# Patient Record
Sex: Female | Born: 1960
Health system: Southern US, Community
[De-identification: ages and names within clinical notes are randomized; demographics above are authoritative.]

## PROBLEM LIST (undated history)

## (undated) HISTORY — PX: CERVICAL ABLATION: SHX5771

---

## 2012-11-06 ENCOUNTER — Ambulatory Visit: Payer: Self-pay | Admitting: Internal Medicine

## 2012-12-03 ENCOUNTER — Ambulatory Visit: Payer: Self-pay | Admitting: Internal Medicine

## 2013-12-09 ENCOUNTER — Ambulatory Visit: Payer: Self-pay | Admitting: Internal Medicine

## 2013-12-09 LAB — HM MAMMOGRAPHY: HM MAMMO: NORMAL

## 2013-12-09 LAB — LIPID PANEL
CHOLESTEROL: 188 mg/dL (ref 0–200)
HDL: 62 mg/dL (ref 35–70)
LDL Cholesterol: 110 mg/dL
TRIGLYCERIDES: 79 mg/dL (ref 40–160)

## 2013-12-09 LAB — TSH: TSH: 0.1 u[IU]/mL — AB (ref <=5.90)

## 2013-12-09 LAB — HM PAP SMEAR: HM PAP: NEGATIVE

## 2013-12-09 LAB — CBC AND DIFFERENTIAL: HEMOGLOBIN: 13.4 g/dL (ref 12.0–16.0)

## 2014-07-25 ENCOUNTER — Encounter: Payer: Self-pay | Admitting: Internal Medicine

## 2014-07-25 ENCOUNTER — Other Ambulatory Visit: Payer: Self-pay | Admitting: Internal Medicine

## 2014-07-25 DIAGNOSIS — G43009 Migraine without aura, not intractable, without status migrainosus: Secondary | ICD-10-CM | POA: Insufficient documentation

## 2014-07-25 DIAGNOSIS — Z8371 Family history of colonic polyps: Secondary | ICD-10-CM | POA: Insufficient documentation

## 2014-07-25 DIAGNOSIS — N951 Menopausal and female climacteric states: Secondary | ICD-10-CM | POA: Insufficient documentation

## 2014-07-25 DIAGNOSIS — F32A Depression, unspecified: Secondary | ICD-10-CM | POA: Insufficient documentation

## 2014-07-25 DIAGNOSIS — G2581 Restless legs syndrome: Secondary | ICD-10-CM | POA: Insufficient documentation

## 2014-07-25 DIAGNOSIS — E039 Hypothyroidism, unspecified: Secondary | ICD-10-CM | POA: Insufficient documentation

## 2014-07-25 DIAGNOSIS — F329 Major depressive disorder, single episode, unspecified: Secondary | ICD-10-CM | POA: Insufficient documentation

## 2014-07-25 DIAGNOSIS — R87619 Unspecified abnormal cytological findings in specimens from cervix uteri: Secondary | ICD-10-CM | POA: Insufficient documentation

## 2014-07-25 HISTORY — DX: Depression, unspecified: F32.A

## 2014-07-25 HISTORY — DX: Menopausal and female climacteric states: N95.1

## 2014-08-28 ENCOUNTER — Other Ambulatory Visit: Payer: Self-pay

## 2014-08-28 MED ORDER — LEVOTHYROXINE SODIUM 75 MCG PO TABS: 75.0000 ug | ORAL_TABLET | Freq: Every day | ORAL | Status: DC

## 2014-08-28 MED ORDER — ESCITALOPRAM OXALATE 10 MG PO TABS: 10.0000 mg | ORAL_TABLET | Freq: Every day | ORAL | Status: DC

## 2015-03-01 ENCOUNTER — Other Ambulatory Visit: Payer: Self-pay | Admitting: Internal Medicine

## 2015-03-03 NOTE — Telephone Encounter (Signed)
pts coming in on 1/31 for cpe

## 2015-03-23 ENCOUNTER — Encounter: Payer: Self-pay | Admitting: Internal Medicine

## 2015-03-23 ENCOUNTER — Ambulatory Visit (INDEPENDENT_AMBULATORY_CARE_PROVIDER_SITE_OTHER): Payer: BLUE CROSS/BLUE SHIELD | Admitting: Internal Medicine

## 2015-03-23 VITALS — BP 104/60 | HR 84 | Ht 65.0 in | Wt 147.8 lb

## 2015-03-23 DIAGNOSIS — Z1239 Encounter for other screening for malignant neoplasm of breast: Secondary | ICD-10-CM

## 2015-03-23 DIAGNOSIS — E034 Atrophy of thyroid (acquired): Secondary | ICD-10-CM | POA: Diagnosis not present

## 2015-03-23 DIAGNOSIS — Z1159 Encounter for screening for other viral diseases: Secondary | ICD-10-CM | POA: Diagnosis not present

## 2015-03-23 DIAGNOSIS — N951 Menopausal and female climacteric states: Secondary | ICD-10-CM

## 2015-03-23 DIAGNOSIS — E038 Other specified hypothyroidism: Secondary | ICD-10-CM

## 2015-03-23 DIAGNOSIS — Z0000 Encounter for general adult medical examination without abnormal findings: Secondary | ICD-10-CM

## 2015-03-23 MED ORDER — LEVOTHYROXINE SODIUM 75 MCG PO TABS: 75.0000 ug | ORAL_TABLET | Freq: Every day | ORAL | Status: DC

## 2015-03-23 NOTE — Patient Instructions (Signed)
Breast Self-Awareness Practicing breast self-awareness may pick up problems early, prevent significant medical complications, and possibly save your life. By practicing breast self-awareness, you can become familiar with how your breasts look and feel and if your breasts are changing. This allows you to notice changes early. It can also offer you some reassurance that your breast health is good. One way to learn what is normal for your breasts and whether your breasts are changing is to do a breast self-exam. If you find a lump or something that was not present in the past, it is best to contact your caregiver right away. Other findings that should be evaluated by your caregiver include nipple discharge, especially if it is bloody; skin changes or reddening; areas where the skin seems to be pulled in (retracted); or new lumps and bumps. Breast pain is seldom associated with cancer (malignancy), but should also be evaluated by a caregiver. HOW TO PERFORM A BREAST SELF-EXAM The best time to examine your breasts is 5-7 days after your menstrual period is over. During menstruation, the breasts are lumpier, and it may be more difficult to pick up changes. If you do not menstruate, have reached menopause, or had your uterus removed (hysterectomy), you should examine your breasts at regular intervals, such as monthly. If you are breastfeeding, examine your breasts after a feeding or after using a breast pump. Breast implants do not decrease the risk for lumps or tumors, so continue to perform breast self-exams as recommended. Talk to your caregiver about how to determine the difference between the implant and breast tissue. Also, talk about the amount of pressure you should use during the exam. Over time, you will become more familiar with the variations of your breasts and more comfortable with the exam. A breast self-exam requires you to remove all your clothes above the waist. 1. Look at your breasts and nipples.  Stand in front of a mirror in a room with good lighting. With your hands on your hips, push your hands firmly downward. Look for a difference in shape, contour, and size from one breast to the other (asymmetry). Asymmetry includes puckers, dips, or bumps. Also, look for skin changes, such as reddened or scaly areas on the breasts. Look for nipple changes, such as discharge, dimpling, repositioning, or redness. 2. Carefully feel your breasts. This is best done either in the shower or tub while using soapy water or when flat on your back. Place the arm (on the side of the breast you are examining) above your head. Use the pads (not the fingertips) of your three middle fingers on your opposite hand to feel your breasts. Start in the underarm area and use  inch (2 cm) overlapping circles to feel your breast. Use 3 different levels of pressure (light, medium, and firm pressure) at each circle before moving to the next circle. The light pressure is needed to feel the tissue closest to the skin. The medium pressure will help to feel breast tissue a little deeper, while the firm pressure is needed to feel the tissue close to the ribs. Continue the overlapping circles, moving downward over the breast until you feel your ribs below your breast. Then, move one finger-width towards the center of the body. Continue to use the  inch (2 cm) overlapping circles to feel your breast as you move slowly up toward the collar bone (clavicle) near the base of the neck. Continue the up and down exam using all 3 pressures until you reach the   middle of the chest. Do this with each breast, carefully feeling for lumps or changes. 3.  Keep a written record with breast changes or normal findings for each breast. By writing this information down, you do not need to depend only on memory for size, tenderness, or location. Write down where you are in your menstrual cycle, if you are still menstruating. Breast tissue can have some lumps or  thick tissue. However, see your caregiver if you find anything that concerns you.  SEEK MEDICAL CARE IF:  You see a change in shape, contour, or size of your breasts or nipples.   You see skin changes, such as reddened or scaly areas on the breasts or nipples.   You have an unusual discharge from your nipples.   You feel a new lump or unusually thick areas.    This information is not intended to replace advice given to you by your health care provider. Make sure you discuss any questions you have with your health care provider.   Document Released: 02/06/2005 Document Revised: 01/24/2012 Document Reviewed: 05/24/2011 Elsevier Interactive Patient Education 2016 [employer]  

## 2015-03-23 NOTE — Progress Notes (Signed)
Date:  03/23/2015   Name:  Courtney Davis   DOB:  05/03/1960   MRN:  [id number]   Chief Complaint: Annual Exam Courtney Davis is a 55 y.o. female who presents today for her Complete Annual Exam. She feels well. She reports exercising regularly. She reports she is sleeping poorly due to hot flashes. She is very hesitant to have a colonoscopy. She would be a good candidate for cologuard testing. She is slightly concerned she's gained about 15 pounds in the past year and a half - she continues to exercise but attributes her weight gain to increased sweets and cravings.  Thyroid Problem Presents for follow-up visit. Patient reports no anxiety, constipation, depressed mood, diaphoresis, fatigue or palpitations. The symptoms have been stable. Past treatments include levothyroxine. The treatment provided significant relief.  Depression        The current episode started more than 1 month ago.   The onset quality is gradual (due to sweats at night).   The problem occurs rarely.  The problem has been resolved since onset.  Associated symptoms include headaches.  Associated symptoms include no decreased concentration and no fatigue.  Past treatments include SSRIs - Selective serotonin reuptake inhibitors (weaned off of lexapro several months ago).  Past medical history includes thyroid problem.    Depression screen [facility] 2/9 03/23/2015  Decreased Interest 0  Down, Depressed, Hopeless 0  PHQ - 2 Score 0     Review of Systems  Constitutional: Negative for fever, chills, diaphoresis and fatigue.  HENT: Negative for hearing loss, sinus pressure, tinnitus, trouble swallowing and voice change.   Eyes: Negative for visual disturbance.  Respiratory: Negative for cough, chest tightness and shortness of breath.   Cardiovascular: Negative for chest pain, palpitations and leg swelling.  Gastrointestinal: Negative for constipation and blood in stool.  Genitourinary: Negative for dysuria, vaginal  bleeding, vaginal discharge and difficulty urinating.  Musculoskeletal: Negative for back pain, joint swelling and arthralgias.  Skin: Negative for color change and rash.  Neurological: Positive for headaches. Negative for dizziness and light-headedness.  Hematological: Negative for adenopathy.  Psychiatric/Behavioral: Positive for depression and sleep disturbance. Negative for dysphoric mood and decreased concentration. The patient is not nervous/anxious.     Patient Active Problem List   Diagnosis Date Noted  . Hypothyroidism due to acquired atrophy of thyroid 03/23/2015  . Abnormal cervical Papanicolaou smear 07/25/2014  . Clinical depression 07/25/2014  . Family history of colonic polyps 07/25/2014  . Climacteric 07/25/2014  . Migraine without aura and responsive to treatment 07/25/2014  . Restless leg 07/25/2014    Prior to Admission medications   Medication Sig Start Date End Date Taking? Authorizing Provider  levothyroxine (SYNTHROID, LEVOTHROID) 75 MCG tablet TAKE ONE TABLET BY MOUTH ONCE DAILY 03/01/15  Yes Glean Hess, MD  clonazePAM (KLONOPIN) 0.5 MG tablet TAKE 1 1/2 TABLET BY MOUTH EVERY DAY Patient not taking: Reported on 03/23/2015 07/27/14   Glean Hess, MD  escitalopram (LEXAPRO) 10 MG tablet TAKE ONE TABLET BY MOUTH ONCE DAILY Patient not taking: Reported on 03/23/2015 03/01/15   Glean Hess, MD    No Known Allergies  Past Surgical History  Procedure Laterality Date  . Cervical ablation      Social History  Substance Use Topics  . Smoking status: Never Smoker   . Smokeless tobacco: None  . Alcohol Use: 1.2 oz/week    2 Standard drinks or equivalent per week     Comment: occasional  Medication list has been reviewed and updated.   Physical Exam  Constitutional: She is oriented to person, place, and time. She appears well-developed and well-nourished. No distress.  HENT:  Head: Normocephalic and atraumatic.  Right Ear: Tympanic membrane  and ear canal normal.  Left Ear: Tympanic membrane and ear canal normal.  Nose: Right sinus exhibits no maxillary sinus tenderness. Left sinus exhibits no maxillary sinus tenderness.  Mouth/Throat: Uvula is midline and oropharynx is clear and moist.  Eyes: Conjunctivae and EOM are normal. Right eye exhibits no discharge. Left eye exhibits no discharge. No scleral icterus.  Neck: Normal range of motion. Carotid bruit is not present. No erythema present. No thyromegaly present.  Cardiovascular: Normal rate, regular rhythm, normal heart sounds and normal pulses.   Pulmonary/Chest: Effort normal. No respiratory distress. She has no wheezes. Right breast exhibits no mass, no nipple discharge, no skin change and no tenderness. Left breast exhibits no mass, no nipple discharge, no skin change and no tenderness.  Abdominal: Soft. Bowel sounds are normal. There is no hepatosplenomegaly. There is no tenderness. There is no CVA tenderness.  Musculoskeletal: Normal range of motion.  Lymphadenopathy:    She has no cervical adenopathy.    She has no axillary adenopathy.  Neurological: She is alert and oriented to person, place, and time. She has normal reflexes. No cranial nerve deficit or sensory deficit.  Skin: Skin is warm, dry and intact. No rash noted.  Psychiatric: She has a normal mood and affect. Her speech is normal and behavior is normal. Thought content normal.  Nursing note and vitals reviewed.   BP 104/60 mmHg  Pulse 84  Ht 5\' 5"  (1.651 m)  Wt 147 lb 12.8 oz (67.042 kg)  BMI 24.60 kg/m2  Assessment and Plan: 1. Annual physical exam Pt will check on Cologuard coverage Doing well off of anti-depressant medication - CBC with Differential/Platelet - Comprehensive metabolic panel - Lipid panel - POCT urinalysis dipstick  2. Breast cancer screening - MM DIGITAL SCREENING BILATERAL; Future  3. Hypothyroidism due to acquired atrophy of thyroid supplemented - TSH - levothyroxine  (SYNTHROID, LEVOTHROID) 75 MCG tablet; Take 1 tablet (75 mcg total) by mouth daily.  Dispense: 90 tablet; Refill: 3  4. Climacteric Causing some sleep issues and weight gain May have been attenuated by SSRI therapy that she recently stopped Continue exercise and monitor diet OTC sleep aids or soy/black cohosh supplements may be helpful  5. Need for hepatitis C screening test - Hepatitis C antibody   Halina Maidens, MD [facility]  03/23/2015

## 2015-03-24 ENCOUNTER — Telehealth: Payer: Self-pay

## 2015-03-24 LAB — LIPID PANEL
CHOLESTEROL TOTAL: 204 mg/dL — AB (ref 100–199)
Chol/HDL Ratio: 3.3 ratio units (ref 0.0–4.4)
HDL: 61 mg/dL (ref >39)
LDL CALC: 126 mg/dL — AB (ref 0–99)
TRIGLYCERIDES: 83 mg/dL (ref 0–149)
VLDL CHOLESTEROL CAL: 17 mg/dL (ref 5–40)

## 2015-03-24 LAB — CBC WITH DIFFERENTIAL/PLATELET
BASOS: 1 %
Basophils Absolute: 0.1 10*3/uL (ref 0.0–0.2)
EOS (ABSOLUTE): 0.1 10*3/uL (ref 0.0–0.4)
EOS: 2 %
Hematocrit: 40.7 % (ref 34.0–46.6)
Hemoglobin: 13.3 g/dL (ref 11.1–15.9)
IMMATURE GRANS (ABS): 0 10*3/uL (ref 0.0–0.1)
IMMATURE GRANULOCYTES: 0 %
LYMPHS: 38 %
Lymphocytes Absolute: 2.1 10*3/uL (ref 0.7–3.1)
MCH: 29.4 pg (ref 26.6–33.0)
MCHC: 32.7 g/dL (ref 31.5–35.7)
MCV: 90 fL (ref 79–97)
MONOS ABS: 0.5 10*3/uL (ref 0.1–0.9)
Monocytes: 8 %
NEUTROS PCT: 51 %
Neutrophils Absolute: 2.7 10*3/uL (ref 1.4–7.0)
PLATELETS: 248 10*3/uL (ref 150–379)
RBC: 4.53 x10E6/uL (ref 3.77–5.28)
RDW: 13.7 % (ref 12.3–15.4)
WBC: 5.4 10*3/uL (ref 3.4–10.8)

## 2015-03-24 LAB — COMPREHENSIVE METABOLIC PANEL
ALT: 18 IU/L (ref 0–32)
AST: 18 IU/L (ref 0–40)
Albumin/Globulin Ratio: 1.7 (ref 1.1–2.5)
Albumin: 4.4 g/dL (ref 3.5–5.5)
Alkaline Phosphatase: 58 IU/L (ref 39–117)
BUN/Creatinine Ratio: 26 — ABNORMAL HIGH (ref 9–23)
BUN: 24 mg/dL (ref 6–24)
Bilirubin Total: 0.3 mg/dL (ref 0.0–1.2)
CALCIUM: 9.4 mg/dL (ref 8.7–10.2)
CO2: 25 mmol/L (ref 18–29)
CREATININE: 0.91 mg/dL (ref 0.57–1.00)
Chloride: 101 mmol/L (ref 96–106)
GFR, EST AFRICAN AMERICAN: 82 mL/min/{1.73_m2} (ref >59)
GFR, EST NON AFRICAN AMERICAN: 71 mL/min/{1.73_m2} (ref >59)
Globulin, Total: 2.6 g/dL (ref 1.5–4.5)
Glucose: 80 mg/dL (ref 65–99)
POTASSIUM: 4.1 mmol/L (ref 3.5–5.2)
Sodium: 140 mmol/L (ref 134–144)
TOTAL PROTEIN: 7 g/dL (ref 6.0–8.5)

## 2015-03-24 LAB — HEPATITIS C ANTIBODY: Hep C Virus Ab: <0.1 s/co ratio (ref 0.0–0.9)

## 2015-03-24 LAB — TSH: TSH: 0.196 u[IU]/mL — ABNORMAL LOW (ref 0.450–4.500)

## 2015-03-24 NOTE — Telephone Encounter (Signed)
Spoke with patient. Patient advised of all results and verbalized understanding. Will call back with any future questions or concerns. MAH  

## 2015-03-24 NOTE — Telephone Encounter (Signed)
-----   Message from Glean Hess, MD sent at 03/24/2015 11:55 AM EST ----- Labs are good.  Cholesterol is borderline elevated but overall risk is normal so no medication is needed.

## 2015-03-24 NOTE — Telephone Encounter (Signed)
Left message for patient to call back  

## 2016-03-31 ENCOUNTER — Ambulatory Visit (INDEPENDENT_AMBULATORY_CARE_PROVIDER_SITE_OTHER): Payer: BLUE CROSS/BLUE SHIELD | Admitting: Internal Medicine

## 2016-03-31 ENCOUNTER — Encounter: Payer: Self-pay | Admitting: Internal Medicine

## 2016-03-31 VITALS — BP 126/76 | HR 80 | Temp 98.0°F | Resp 16 | Ht 65.0 in | Wt 153.0 lb

## 2016-03-31 DIAGNOSIS — Z0000 Encounter for general adult medical examination without abnormal findings: Secondary | ICD-10-CM | POA: Diagnosis not present

## 2016-03-31 DIAGNOSIS — Z1231 Encounter for screening mammogram for malignant neoplasm of breast: Secondary | ICD-10-CM | POA: Diagnosis not present

## 2016-03-31 DIAGNOSIS — E034 Atrophy of thyroid (acquired): Secondary | ICD-10-CM | POA: Diagnosis not present

## 2016-03-31 DIAGNOSIS — Z1239 Encounter for other screening for malignant neoplasm of breast: Secondary | ICD-10-CM

## 2016-03-31 DIAGNOSIS — G43009 Migraine without aura, not intractable, without status migrainosus: Secondary | ICD-10-CM

## 2016-03-31 DIAGNOSIS — N951 Menopausal and female climacteric states: Secondary | ICD-10-CM

## 2016-03-31 LAB — POCT URINALYSIS DIPSTICK
Bilirubin, UA: NEGATIVE
GLUCOSE UA: NEGATIVE
Ketones, UA: NEGATIVE
Leukocytes, UA: NEGATIVE
NITRITE UA: NEGATIVE
PH UA: 6
Protein, UA: NEGATIVE
SPEC GRAV UA: 1.015
UROBILINOGEN UA: 0.2

## 2016-03-31 MED ORDER — ESTRADIOL 0.5 MG PO TABS: 0.5000 mg | ORAL_TABLET | Freq: Every day | ORAL | 12 refills | Status: DC

## 2016-03-31 MED ORDER — MEDROXYPROGESTERONE ACETATE 2.5 MG PO TABS: 2.5000 mg | ORAL_TABLET | Freq: Every day | ORAL | 12 refills | Status: DC

## 2016-03-31 MED ORDER — LEVOTHYROXINE SODIUM 75 MCG PO TABS: 75.0000 ug | ORAL_TABLET | Freq: Every day | ORAL | 3 refills | Status: DC

## 2016-03-31 NOTE — Patient Instructions (Signed)
Check Cologuard coverage  Schedule Mammogram  Consult dermatology for Skin Survey

## 2016-03-31 NOTE — Progress Notes (Signed)
Date:  03/31/2016   Name:  Courtney Davis   DOB:  Apr 06, 1960   MRN:  [id number]   Chief Complaint: Annual Exam and Night Sweats Lilan Masso Wigand is a 56 y.o. female who presents today for her Complete Annual Exam. She feels well. She reports exercising regularly. She reports she is sleeping poorly due to sweats from menopause.   Thyroid Problem  Presents for follow-up visit. Patient reports no anxiety, constipation, diarrhea, fatigue, menstrual problem, palpitations, tremors or weight loss. The symptoms have been stable.  Migraine   This is a recurrent problem. The problem occurs seasonly. The pain is located in the bilateral and frontal region. The pain quality is similar to prior headaches. Pertinent negatives include no abdominal pain, coughing, dizziness, fever, hearing loss, tinnitus, vomiting or weight loss.   Menopausal sx - having terrible sweats at night - awakening several times at night.  She has not taken HRT or tried any soy or black cohosh.   Review of Systems  Constitutional: Negative for chills, fatigue, fever and weight loss.  HENT: Negative for congestion, hearing loss, tinnitus, trouble swallowing and voice change.   Eyes: Negative for visual disturbance.  Respiratory: Negative for cough, chest tightness, shortness of breath and wheezing.   Cardiovascular: Negative for chest pain, palpitations and leg swelling.  Gastrointestinal: Negative for abdominal pain, constipation, diarrhea and vomiting.  Endocrine: Negative for polydipsia and polyuria.  Genitourinary: Negative for dysuria, frequency, genital sores, menstrual problem, vaginal bleeding and vaginal discharge.       Severe night sweats  Musculoskeletal: Negative for arthralgias, gait problem and joint swelling.  Skin: Negative for color change and rash.  Neurological: Negative for dizziness, tremors, light-headedness and headaches.  Hematological: Negative for adenopathy. Does not bruise/bleed easily.    Psychiatric/Behavioral: Positive for sleep disturbance. Negative for dysphoric mood. The patient is not nervous/anxious.     Patient Active Problem List   Diagnosis Date Noted  . Hypothyroidism due to acquired atrophy of thyroid 03/23/2015  . Abnormal cervical Papanicolaou smear 07/25/2014  . Clinical depression 07/25/2014  . Family history of colonic polyps 07/25/2014  . Climacteric 07/25/2014  . Migraine without aura and responsive to treatment 07/25/2014  . Restless leg 07/25/2014    Prior to Admission medications   Medication Sig Start Date End Date Taking? Authorizing Provider  levothyroxine (SYNTHROID, LEVOTHROID) 75 MCG tablet Take 1 tablet (75 mcg total) by mouth daily. 03/23/15  Yes Glean Hess, MD    No Known Allergies  Past Surgical History:  Procedure Laterality Date  . CERVICAL ABLATION      Social History  Substance Use Topics  . Smoking status: Never Smoker  . Smokeless tobacco: Never Used  . Alcohol use 1.2 oz/week    2 Standard drinks or equivalent per week     Comment: occasional     Medication list has been reviewed and updated.   Physical Exam  Constitutional: She is oriented to person, place, and time. She appears well-developed and well-nourished. No distress.  HENT:  Head: Normocephalic and atraumatic.  Right Ear: Tympanic membrane and ear canal normal.  Left Ear: Tympanic membrane and ear canal normal.  Nose: Right sinus exhibits no maxillary sinus tenderness. Left sinus exhibits no maxillary sinus tenderness.  Mouth/Throat: Uvula is midline and oropharynx is clear and moist.  Eyes: Conjunctivae and EOM are normal. Right eye exhibits no discharge. Left eye exhibits no discharge. No scleral icterus.  Neck: Normal range of motion. Carotid bruit is  not present. No erythema present. No thyromegaly present.  Cardiovascular: Normal rate, regular rhythm, normal heart sounds and normal pulses.   Pulmonary/Chest: Effort normal. No respiratory  distress. She has no wheezes. Right breast exhibits no mass, no nipple discharge, no skin change and no tenderness. Left breast exhibits no mass, no nipple discharge, no skin change and no tenderness.  Abdominal: Soft. Bowel sounds are normal. There is no hepatosplenomegaly. There is no tenderness. There is no CVA tenderness.  Musculoskeletal: Normal range of motion.  Lymphadenopathy:    She has no cervical adenopathy.    She has no axillary adenopathy.  Neurological: She is alert and oriented to person, place, and time. She has normal reflexes. No cranial nerve deficit or sensory deficit.  Skin: Skin is warm, dry and intact. No rash noted.  Psychiatric: She has a normal mood and affect. Her speech is normal and behavior is normal. Thought content normal.  Nursing note and vitals reviewed.   BP 126/76 (BP Location: Right Arm, Patient Position: Sitting, Cuff Size: Normal)   Pulse 80   Temp 98 F (36.7 C)   Resp 16   Ht 5\' 5"  (1.651 m)   Wt 153 lb (69.4 kg)   BMI 25.46 kg/m   Assessment and Plan: 1. Annual physical exam Continue regular exercise and healthy diet Consider Calcium and vitamin D daily - Comprehensive metabolic panel - Lipid panel - POCT urinalysis dipstick  2. Breast cancer screening Schedule mammogram  3. Hypothyroidism due to acquired atrophy of thyroid supplemented - levothyroxine (SYNTHROID, LEVOTHROID) 75 MCG tablet; Take 1 tablet (75 mcg total) by mouth daily.  Dispense: 90 tablet; Refill: 3 - TSH  4. Migraine without aura and responsive to treatment Has responded to otc medications - CBC with Differential/Platelet  5. Climacteric Begin HRT daily and wean down to 2-3 times per week as sx allow - estradiol (ESTRACE) 0.5 MG tablet; Take 1 tablet (0.5 mg total) by mouth daily.  Dispense: 30 tablet; Refill: 12 - medroxyPROGESTERone (PROVERA) 2.5 MG tablet; Take 1 tablet (2.5 mg total) by mouth daily.  Dispense: 30 tablet; Refill: [place],  MD [facility]  03/31/2016

## 2016-04-01 LAB — COMPREHENSIVE METABOLIC PANEL
A/G RATIO: 1.4 (ref 1.2–2.2)
ALK PHOS: 77 IU/L (ref 39–117)
ALT: 17 IU/L (ref 0–32)
AST: 20 IU/L (ref 0–40)
Albumin: 4.2 g/dL (ref 3.5–5.5)
BILIRUBIN TOTAL: 0.3 mg/dL (ref 0.0–1.2)
BUN/Creatinine Ratio: 24 — ABNORMAL HIGH (ref 9–23)
BUN: 23 mg/dL (ref 6–24)
CALCIUM: 9.6 mg/dL (ref 8.7–10.2)
CHLORIDE: 101 mmol/L (ref 96–106)
CO2: 25 mmol/L (ref 18–29)
Creatinine, Ser: 0.97 mg/dL (ref 0.57–1.00)
GFR calc Af Amer: 76 mL/min/{1.73_m2} (ref >59)
GFR calc non Af Amer: 65 mL/min/{1.73_m2} (ref >59)
Globulin, Total: 3 g/dL (ref 1.5–4.5)
Glucose: 73 mg/dL (ref 65–99)
POTASSIUM: 4.6 mmol/L (ref 3.5–5.2)
SODIUM: 142 mmol/L (ref 134–144)
Total Protein: 7.2 g/dL (ref 6.0–8.5)

## 2016-04-01 LAB — LIPID PANEL
CHOLESTEROL TOTAL: 200 mg/dL — AB (ref 100–199)
Chol/HDL Ratio: 3.5 ratio units (ref 0.0–4.4)
HDL: 57 mg/dL (ref >39)
LDL Calculated: 124 mg/dL — ABNORMAL HIGH (ref 0–99)
Triglycerides: 95 mg/dL (ref 0–149)
VLDL Cholesterol Cal: 19 mg/dL (ref 5–40)

## 2016-04-01 LAB — CBC WITH DIFFERENTIAL/PLATELET
BASOS: 2 %
Basophils Absolute: 0.1 10*3/uL (ref 0.0–0.2)
EOS (ABSOLUTE): 0.1 10*3/uL (ref 0.0–0.4)
Eos: 1 %
Hematocrit: 40 % (ref 34.0–46.6)
Hemoglobin: 13 g/dL (ref 11.1–15.9)
IMMATURE GRANULOCYTES: 0 %
Immature Grans (Abs): 0 10*3/uL (ref 0.0–0.1)
LYMPHS ABS: 1.5 10*3/uL (ref 0.7–3.1)
Lymphs: 29 %
MCH: 29.7 pg (ref 26.6–33.0)
MCHC: 32.5 g/dL (ref 31.5–35.7)
MCV: 91 fL (ref 79–97)
MONOS ABS: 0.5 10*3/uL (ref 0.1–0.9)
Monocytes: 10 %
NEUTROS PCT: 58 %
Neutrophils Absolute: 3.1 10*3/uL (ref 1.4–7.0)
PLATELETS: 255 10*3/uL (ref 150–379)
RBC: 4.38 x10E6/uL (ref 3.77–5.28)
RDW: 13.6 % (ref 12.3–15.4)
WBC: 5.3 10*3/uL (ref 3.4–10.8)

## 2016-04-01 LAB — TSH: TSH: 0.782 u[IU]/mL (ref 0.450–4.500)

## 2017-03-23 ENCOUNTER — Ambulatory Visit (INDEPENDENT_AMBULATORY_CARE_PROVIDER_SITE_OTHER): Payer: BLUE CROSS/BLUE SHIELD | Admitting: Internal Medicine

## 2017-03-23 ENCOUNTER — Encounter: Payer: Self-pay | Admitting: Internal Medicine

## 2017-03-23 VITALS — BP 112/64 | HR 65 | Ht 65.0 in | Wt 153.0 lb

## 2017-03-23 DIAGNOSIS — R87610 Atypical squamous cells of undetermined significance on cytologic smear of cervix (ASC-US): Secondary | ICD-10-CM | POA: Diagnosis not present

## 2017-03-23 DIAGNOSIS — Z1231 Encounter for screening mammogram for malignant neoplasm of breast: Secondary | ICD-10-CM

## 2017-03-23 DIAGNOSIS — Z1239 Encounter for other screening for malignant neoplasm of breast: Secondary | ICD-10-CM

## 2017-03-23 DIAGNOSIS — E034 Atrophy of thyroid (acquired): Secondary | ICD-10-CM | POA: Diagnosis not present

## 2017-03-23 DIAGNOSIS — N951 Menopausal and female climacteric states: Secondary | ICD-10-CM

## 2017-03-23 MED ORDER — MEDROXYPROGESTERONE ACETATE 2.5 MG PO TABS: 2.5000 mg | ORAL_TABLET | Freq: Every day | ORAL | 12 refills | Status: DC

## 2017-03-23 MED ORDER — ESTRADIOL 0.5 MG PO TABS: 0.5000 mg | ORAL_TABLET | Freq: Every day | ORAL | 12 refills | Status: DC

## 2017-03-23 MED ORDER — LEVOTHYROXINE SODIUM 75 MCG PO TABS: 75.0000 ug | ORAL_TABLET | Freq: Every day | ORAL | 3 refills | Status: DC

## 2017-03-23 NOTE — Progress Notes (Signed)
Date:  03/23/2017   Name:  Courtney Davis   DOB:  11/21/1960   MRN:  [id number]   Chief Complaint: Hypothyroidism Thyroid Problem  Presents for follow-up visit. Patient reports no anxiety, constipation, depressed mood, fatigue, leg swelling, palpitations or weight gain. The symptoms have been stable.   Menopause sx - started on HRT last year.  Taking medications every other day and sx are much better. No breast tenderness, headaches or vaginal bleeding.  Allergy sx -  Seems to be worse since spending the week in [place].  She is wondering what she can take.  Review of Systems  Constitutional: Negative for chills, fatigue, fever, unexpected weight change and weight gain.  Eyes: Negative for visual disturbance.  Respiratory: Negative for chest tightness, shortness of breath and wheezing.   Cardiovascular: Negative for chest pain and palpitations.  Gastrointestinal: Negative for abdominal pain and constipation.  Musculoskeletal: Negative for arthralgias.  Skin: Negative for color change and rash.  Psychiatric/Behavioral: The patient is not nervous/anxious.     Patient Active Problem List   Diagnosis Date Noted  . Hypothyroidism due to acquired atrophy of thyroid 03/23/2015  . Abnormal cervical Papanicolaou smear 07/25/2014  . Clinical depression 07/25/2014  . Family history of colonic polyps 07/25/2014  . Climacteric 07/25/2014  . Migraine without aura and responsive to treatment 07/25/2014  . Restless leg 07/25/2014    Prior to Admission medications   Medication Sig Start Date End Date Taking? Authorizing Provider  estradiol (ESTRACE) 0.5 MG tablet Take 1 tablet (0.5 mg total) by mouth daily. 03/31/16  Yes Glean Hess, MD  levothyroxine (SYNTHROID, LEVOTHROID) 75 MCG tablet Take 1 tablet (75 mcg total) by mouth daily. 03/31/16  Yes Glean Hess, MD  medroxyPROGESTERone (PROVERA) 2.5 MG tablet Take 1 tablet (2.5 mg total) by mouth daily. 03/31/16  Yes  Glean Hess, MD    No Known Allergies  Past Surgical History:  Procedure Laterality Date  . CERVICAL ABLATION      Social History   Tobacco Use  . Smoking status: Never Smoker  . Smokeless tobacco: Never Used  Substance Use Topics  . Alcohol use: Yes    Alcohol/week: 1.2 oz    Types: 2 Standard drinks or equivalent per week    Comment: occasional  . Drug use: No     Medication list has been reviewed and updated.  PHQ 2/9 Scores 03/23/2015  PHQ - 2 Score 0    Physical Exam  Constitutional: She is oriented to person, place, and time. She appears well-developed. No distress.  HENT:  Head: Normocephalic and atraumatic.  Neck: Normal range of motion. Neck supple.  Cardiovascular: Normal rate, regular rhythm and normal heart sounds.  Pulmonary/Chest: Effort normal and breath sounds normal. No respiratory distress. She has no wheezes.  Musculoskeletal: Normal range of motion. She exhibits no edema or tenderness.  Neurological: She is alert and oriented to person, place, and time.  Skin: Skin is warm and dry. No rash noted.  Psychiatric: She has a normal mood and affect. Her behavior is normal. Thought content normal.  Nursing note and vitals reviewed.   BP 112/64   Pulse 65   Ht 5\' 5"  (1.651 m)   Wt 153 lb (69.4 kg)   SpO2 100%   BMI 25.46 kg/m   Assessment and Plan: 1. Hypothyroidism due to acquired atrophy of thyroid Continue supplement - levothyroxine (SYNTHROID, LEVOTHROID) 75 MCG tablet; Take 1 tablet (75 mcg total) by mouth daily.  Dispense: 90 tablet; Refill: 3 - TSH  2. Atypical squamous cells of undetermined significance on cytologic smear of cervix (ASC-US) Pap next  3. Climacteric Doing well on HRT - estradiol (ESTRACE) 0.5 MG tablet; Take 1 tablet (0.5 mg total) by mouth daily.  Dispense: 30 tablet; Refill: 12 - medroxyPROGESTERone (PROVERA) 2.5 MG tablet; Take 1 tablet (2.5 mg total) by mouth daily.  Dispense: 30 tablet; Refill: 12  4.  Screening breast examination - MM DIGITAL SCREENING BILATERAL; Future   Meds ordered this encounter  Medications  . estradiol (ESTRACE) 0.5 MG tablet    Sig: Take 1 tablet (0.5 mg total) by mouth daily.    Dispense:  30 tablet    Refill:  12  . levothyroxine (SYNTHROID, LEVOTHROID) 75 MCG tablet    Sig: Take 1 tablet (75 mcg total) by mouth daily.    Dispense:  90 tablet    Refill:  3    This prescription was filled today(03/01/2015). Any refills authorized will be placed on file.  . medroxyPROGESTERone (PROVERA) 2.5 MG tablet    Sig: Take 1 tablet (2.5 mg total) by mouth daily.    Dispense:  30 tablet    Refill:  12    Partially dictated using Editor, commissioning. Any errors are unintentional.  Halina Maidens, MD [facility]  03/23/2017

## 2017-03-24 LAB — TSH: TSH: 1.04 u[IU]/mL (ref 0.450–4.500)

## 2017-08-21 ENCOUNTER — Encounter: Payer: Self-pay | Admitting: Internal Medicine

## 2017-08-22 ENCOUNTER — Encounter: Payer: Self-pay | Admitting: Internal Medicine

## 2017-08-22 ENCOUNTER — Other Ambulatory Visit: Payer: Self-pay | Admitting: Internal Medicine

## 2017-08-22 ENCOUNTER — Other Ambulatory Visit (HOSPITAL_COMMUNITY)
Admission: RE | Admit: 2017-08-22 | Discharge: 2017-08-22 | Disposition: A | Discharge status: Home / Self Care | Payer: BLUE CROSS/BLUE SHIELD | Source: Ambulatory Visit | Attending: Internal Medicine | Admitting: Internal Medicine

## 2017-08-22 ENCOUNTER — Ambulatory Visit
Admission: RE | Admit: 2017-08-22 | Discharge: 2017-08-22 | Disposition: A | Discharge status: Home / Self Care | Payer: BLUE CROSS/BLUE SHIELD | Source: Ambulatory Visit | Attending: Internal Medicine | Admitting: Internal Medicine

## 2017-08-22 ENCOUNTER — Ambulatory Visit (INDEPENDENT_AMBULATORY_CARE_PROVIDER_SITE_OTHER): Payer: BLUE CROSS/BLUE SHIELD | Admitting: Internal Medicine

## 2017-08-22 VITALS — BP 112/80 | HR 70 | Resp 16 | Ht 65.0 in | Wt 152.0 lb

## 2017-08-22 DIAGNOSIS — E034 Atrophy of thyroid (acquired): Secondary | ICD-10-CM

## 2017-08-22 DIAGNOSIS — Z1231 Encounter for screening mammogram for malignant neoplasm of breast: Secondary | ICD-10-CM | POA: Diagnosis not present

## 2017-08-22 DIAGNOSIS — Z0000 Encounter for general adult medical examination without abnormal findings: Secondary | ICD-10-CM | POA: Diagnosis not present

## 2017-08-22 DIAGNOSIS — Z1211 Encounter for screening for malignant neoplasm of colon: Secondary | ICD-10-CM

## 2017-08-22 DIAGNOSIS — Z124 Encounter for screening for malignant neoplasm of cervix: Secondary | ICD-10-CM

## 2017-08-22 DIAGNOSIS — Z1239 Encounter for other screening for malignant neoplasm of breast: Secondary | ICD-10-CM

## 2017-08-22 LAB — POCT URINALYSIS DIPSTICK
Bilirubin, UA: NEGATIVE
Blood, UA: NEGATIVE
GLUCOSE UA: NEGATIVE
KETONES UA: NEGATIVE
LEUKOCYTES UA: NEGATIVE
Nitrite, UA: NEGATIVE
Protein, UA: NEGATIVE
Spec Grav, UA: 1.01 (ref 1.010–1.025)
Urobilinogen, UA: 0.2 E.U./dL
pH, UA: 6 (ref 5.0–8.0)

## 2017-08-22 NOTE — Progress Notes (Signed)
Date:  08/22/2017   Name:  Darrelle Barrell   DOB:  1960/05/14   MRN:  [id number]   Chief Complaint: Annual Exam and Fatigue Baron Sane Paradiso is a 57 y.o. female who presents today for her Complete Annual Exam. She feels fairly well. She reports exercising daily - walking at work. She reports she is sleeping fairly well. Mammogram is scheduled for today.  She wants to do cologuard, even if she has to pay for it.  Her last Pap was ASCUS with negative HPV.  Thyroid Problem  Presents for follow-up visit. Symptoms include fatigue. Patient reports no anxiety, constipation, diarrhea, palpitations or tremors. The symptoms have been stable.    Review of Systems  Constitutional: Positive for fatigue. Negative for chills and fever.  HENT: Negative for congestion, hearing loss, tinnitus, trouble swallowing and voice change.   Eyes: Negative for visual disturbance.  Respiratory: Negative for cough, chest tightness, shortness of breath and wheezing.   Cardiovascular: Negative for chest pain, palpitations and leg swelling.  Gastrointestinal: Negative for abdominal pain, constipation, diarrhea and vomiting.  Endocrine: Negative for polydipsia and polyuria.  Genitourinary: Negative for dysuria, frequency, genital sores, vaginal bleeding and vaginal discharge.  Musculoskeletal: Negative for arthralgias, gait problem and joint swelling.  Skin: Negative for color change and rash.  Neurological: Negative for dizziness, tremors, light-headedness and headaches.  Hematological: Negative for adenopathy. Does not bruise/bleed easily.  Psychiatric/Behavioral: Negative for dysphoric mood and sleep disturbance. The patient is not nervous/anxious.     Patient Active Problem List   Diagnosis Date Noted  . Hypothyroidism due to acquired atrophy of thyroid 03/23/2015  . Abnormal cervical Papanicolaou smear 07/25/2014  . Clinical depression 07/25/2014  . Family history of colonic polyps 07/25/2014  .  Climacteric 07/25/2014  . Migraine without aura and responsive to treatment 07/25/2014  . Restless leg 07/25/2014    Prior to Admission medications   Medication Sig Start Date End Date Taking? Authorizing Provider  estradiol (ESTRACE) 0.5 MG tablet Take 1 tablet (0.5 mg total) by mouth daily. 03/23/17  Yes Glean Hess, MD  levothyroxine (SYNTHROID, LEVOTHROID) 75 MCG tablet Take 1 tablet (75 mcg total) by mouth daily. 03/23/17  Yes Glean Hess, MD  medroxyPROGESTERone (PROVERA) 2.5 MG tablet Take 1 tablet (2.5 mg total) by mouth daily. 03/23/17  Yes Glean Hess, MD    No Known Allergies  Past Surgical History:  Procedure Laterality Date  . CERVICAL ABLATION      Social History   Tobacco Use  . Smoking status: Never Smoker  . Smokeless tobacco: Never Used  Substance Use Topics  . Alcohol use: Yes    Alcohol/week: 1.2 oz    Types: 2 Standard drinks or equivalent per week    Comment: occasional  . Drug use: No     Medication list has been reviewed and updated.  Current Meds  Medication Sig  . estradiol (ESTRACE) 0.5 MG tablet Take 1 tablet (0.5 mg total) by mouth daily.  Marland Kitchen levothyroxine (SYNTHROID, LEVOTHROID) 75 MCG tablet Take 1 tablet (75 mcg total) by mouth daily.  . medroxyPROGESTERone (PROVERA) 2.5 MG tablet Take 1 tablet (2.5 mg total) by mouth daily.    PHQ 2/9 Scores 08/22/2017 03/23/2015  PHQ - 2 Score 0 0    Physical Exam  Constitutional: She is oriented to person, place, and time. She appears well-developed and well-nourished. No distress.  HENT:  Head: Normocephalic and atraumatic.  Right Ear: Tympanic membrane and ear canal  normal.  Left Ear: Tympanic membrane and ear canal normal.  Nose: Right sinus exhibits no maxillary sinus tenderness. Left sinus exhibits no maxillary sinus tenderness.  Mouth/Throat: Uvula is midline and oropharynx is clear and moist.  Eyes: Conjunctivae and EOM are normal. Right eye exhibits no discharge. Left eye  exhibits no discharge. No scleral icterus.  Neck: Normal range of motion. Carotid bruit is not present. No erythema present. No thyromegaly present.  Cardiovascular: Normal rate, regular rhythm, normal heart sounds and normal pulses.  Pulmonary/Chest: Effort normal. No respiratory distress. She has no wheezes. Right breast exhibits no mass, no nipple discharge, no skin change and no tenderness. Left breast exhibits no mass, no nipple discharge, no skin change and no tenderness.  Abdominal: Soft. Bowel sounds are normal. There is no hepatosplenomegaly. There is no tenderness. There is no CVA tenderness.  Genitourinary: Vagina normal and uterus normal. There is no rash, tenderness or lesion on the right labia. There is no rash or lesion on the left labia. Cervix exhibits no motion tenderness, no discharge and no friability. Right adnexum displays no mass and no tenderness. Left adnexum displays no mass and no tenderness.  Musculoskeletal: Normal range of motion.  Lymphadenopathy:    She has no cervical adenopathy.    She has no axillary adenopathy.  Neurological: She is alert and oriented to person, place, and time. She has normal reflexes. No cranial nerve deficit or sensory deficit.  Skin: Skin is warm, dry and intact. No rash noted.  Psychiatric: She has a normal mood and affect. Her speech is normal and behavior is normal. Thought content normal.  Nursing note and vitals reviewed.   BP 112/80   Pulse 70   Resp 16   Ht 5\' 5"  (1.651 m)   Wt 152 lb (68.9 kg)   SpO2 97%   BMI 25.29 kg/m   Assessment and Plan: 1. Annual physical exam Normal exam - CBC with Differential/Platelet - Comprehensive metabolic panel - Lipid panel - POCT urinalysis dipstick  2. Breast cancer screening Mammogram today  3. Colon cancer screening - Cologuard  4. Hypothyroidism due to acquired atrophy of thyroid supplemented - TSH  5. Pap smear for cervical cancer screening - Cytology - PAP   No  orders of the defined types were placed in this encounter.   Partially dictated using Editor, commissioning. Any errors are unintentional.  Halina Maidens, MD [facility]  08/22/2017   There are no diagnoses linked to this encounter.

## 2017-08-23 LAB — COMPREHENSIVE METABOLIC PANEL
ALT: 19 IU/L (ref 0–32)
AST: 15 IU/L (ref 0–40)
Albumin/Globulin Ratio: 1.7 (ref 1.2–2.2)
Albumin: 4.5 g/dL (ref 3.5–5.5)
Alkaline Phosphatase: 48 IU/L (ref 39–117)
BUN/Creatinine Ratio: 24 — ABNORMAL HIGH (ref 9–23)
BUN: 24 mg/dL (ref 6–24)
Bilirubin Total: 0.4 mg/dL (ref 0.0–1.2)
CALCIUM: 9.5 mg/dL (ref 8.7–10.2)
CO2: 24 mmol/L (ref 20–29)
CREATININE: 0.99 mg/dL (ref 0.57–1.00)
Chloride: 107 mmol/L — ABNORMAL HIGH (ref 96–106)
GFR calc Af Amer: 73 mL/min/{1.73_m2} (ref >59)
GFR, EST NON AFRICAN AMERICAN: 63 mL/min/{1.73_m2} (ref >59)
GLUCOSE: 78 mg/dL (ref 65–99)
Globulin, Total: 2.6 g/dL (ref 1.5–4.5)
Potassium: 4.4 mmol/L (ref 3.5–5.2)
Sodium: 143 mmol/L (ref 134–144)
Total Protein: 7.1 g/dL (ref 6.0–8.5)

## 2017-08-23 LAB — LIPID PANEL
CHOL/HDL RATIO: 3.6 ratio (ref 0.0–4.4)
CHOLESTEROL TOTAL: 194 mg/dL (ref 100–199)
HDL: 54 mg/dL (ref >39)
LDL CALC: 123 mg/dL — AB (ref 0–99)
TRIGLYCERIDES: 87 mg/dL (ref 0–149)
VLDL Cholesterol Cal: 17 mg/dL (ref 5–40)

## 2017-08-23 LAB — CBC WITH DIFFERENTIAL/PLATELET
BASOS ABS: 0.1 10*3/uL (ref 0.0–0.2)
Basos: 2 %
EOS (ABSOLUTE): 0.1 10*3/uL (ref 0.0–0.4)
EOS: 2 %
HEMATOCRIT: 38.2 % (ref 34.0–46.6)
Hemoglobin: 12.3 g/dL (ref 11.1–15.9)
IMMATURE GRANULOCYTES: 0 %
Immature Grans (Abs): 0 10*3/uL (ref 0.0–0.1)
Lymphocytes Absolute: 2.2 10*3/uL (ref 0.7–3.1)
Lymphs: 43 %
MCH: 29.6 pg (ref 26.6–33.0)
MCHC: 32.2 g/dL (ref 31.5–35.7)
MCV: 92 fL (ref 79–97)
MONOS ABS: 0.5 10*3/uL (ref 0.1–0.9)
Monocytes: 9 %
NEUTROS PCT: 44 %
Neutrophils Absolute: 2.3 10*3/uL (ref 1.4–7.0)
PLATELETS: 294 10*3/uL (ref 150–450)
RBC: 4.15 x10E6/uL (ref 3.77–5.28)
RDW: 13.7 % (ref 12.3–15.4)
WBC: 5.2 10*3/uL (ref 3.4–10.8)

## 2017-08-23 LAB — TSH: TSH: 1.15 u[IU]/mL (ref 0.450–4.500)

## 2017-08-24 LAB — CYTOLOGY - PAP: Diagnosis: NEGATIVE

## 2017-11-21 ENCOUNTER — Telehealth: Payer: Self-pay

## 2017-11-21 NOTE — Telephone Encounter (Signed)
Called patient and LVM reminding to have cologuard done. Informed if there is any reason why this can not be completed to please call office back.

## 2018-04-22 ENCOUNTER — Other Ambulatory Visit: Payer: Self-pay

## 2018-04-22 DIAGNOSIS — E034 Atrophy of thyroid (acquired): Secondary | ICD-10-CM

## 2018-04-22 MED ORDER — LEVOTHYROXINE SODIUM 75 MCG PO TABS: 75.0000 ug | ORAL_TABLET | Freq: Every day | ORAL | 0 refills | Status: DC

## 2018-04-22 NOTE — Progress Notes (Signed)
Patient called saying she needs refill for thyroid medication. Told her we have not seen her in over a year so she needs to schedule a follow up in the next 30 days and then a CPE when she comes in. She verbalized understanding of this. Sent in 30 days to her pharmacy of choice and scheduled her appt.

## 2018-05-27 ENCOUNTER — Ambulatory Visit: Payer: Self-pay | Admitting: Internal Medicine

## 2018-05-29 ENCOUNTER — Telehealth: Payer: Self-pay

## 2018-05-29 ENCOUNTER — Other Ambulatory Visit: Payer: Self-pay

## 2018-05-29 DIAGNOSIS — E034 Atrophy of thyroid (acquired): Secondary | ICD-10-CM

## 2018-05-29 MED ORDER — LEVOTHYROXINE SODIUM 75 MCG PO TABS: 75.0000 ug | ORAL_TABLET | Freq: Every day | ORAL | 2 refills | Status: DC

## 2018-06-04 NOTE — Telephone Encounter (Signed)
error 

## 2018-06-06 ENCOUNTER — Ambulatory Visit: Payer: Self-pay | Admitting: Internal Medicine

## 2018-08-19 ENCOUNTER — Encounter: Payer: Self-pay | Admitting: Internal Medicine

## 2018-08-19 ENCOUNTER — Ambulatory Visit (INDEPENDENT_AMBULATORY_CARE_PROVIDER_SITE_OTHER): Payer: BC Managed Care – PPO | Admitting: Internal Medicine

## 2018-08-19 ENCOUNTER — Other Ambulatory Visit: Payer: Self-pay

## 2018-08-19 VITALS — BP 112/60 | HR 72 | Ht 65.0 in | Wt 156.0 lb

## 2018-08-19 DIAGNOSIS — Z1211 Encounter for screening for malignant neoplasm of colon: Secondary | ICD-10-CM

## 2018-08-19 DIAGNOSIS — Z1231 Encounter for screening mammogram for malignant neoplasm of breast: Secondary | ICD-10-CM

## 2018-08-19 DIAGNOSIS — E034 Atrophy of thyroid (acquired): Secondary | ICD-10-CM | POA: Diagnosis not present

## 2018-08-19 DIAGNOSIS — E785 Hyperlipidemia, unspecified: Secondary | ICD-10-CM | POA: Diagnosis not present

## 2018-08-19 DIAGNOSIS — N951 Menopausal and female climacteric states: Secondary | ICD-10-CM

## 2018-08-19 DIAGNOSIS — J3089 Other allergic rhinitis: Secondary | ICD-10-CM | POA: Diagnosis not present

## 2018-08-19 NOTE — Progress Notes (Signed)
Date:  08/19/2018   Name:  Courtney Davis   DOB:  Feb 27, 1960   MRN:  [id number]   Chief Complaint: Hypothyroidism  Thyroid Problem Presents for follow-up visit. Patient reports no constipation, depressed mood, diarrhea, fatigue, leg swelling, palpitations or weight gain. The symptoms have been stable.  Allergies - using zyrtec but still has nasal congestion.  She has used Afrin type sprays with good relief but then the congestion returns.  She has not tried [facility]. HRT - on estrogen and progesterone three days a week with good control of symptoms. She is due for a mammogram but lives in [place] now.  She could probably get a mammogram locally if she had an order. Cologuard was ordered last year but she never received the kit - probably because her mail if forwarded.  Review of Systems  Constitutional: Negative for chills, fatigue, fever, unexpected weight change and weight gain.  HENT: Negative for trouble swallowing.   Respiratory: Negative for chest tightness and shortness of breath.   Cardiovascular: Negative for chest pain, palpitations and leg swelling.  Gastrointestinal: Negative for constipation and diarrhea.  Genitourinary: Negative for dysuria.       Menopausal sx are mild  Skin: Negative for color change and rash.  Allergic/Immunologic: Positive for environmental allergies.  Neurological: Negative for dizziness, light-headedness and headaches.  Psychiatric/Behavioral: Negative for sleep disturbance.    Patient Active Problem List   Diagnosis Date Noted  . Hypothyroidism due to acquired atrophy of thyroid 03/23/2015  . Abnormal cervical Papanicolaou smear 07/25/2014  . Clinical depression 07/25/2014  . Family history of colonic polyps 07/25/2014  . Climacteric 07/25/2014  . Migraine without aura and responsive to treatment 07/25/2014  . Restless leg 07/25/2014    No Known Allergies  Past Surgical History:  Procedure Laterality Date  . CERVICAL ABLATION       Social History   Tobacco Use  . Smoking status: Never Smoker  . Smokeless tobacco: Never Used  Substance Use Topics  . Alcohol use: Yes    Alcohol/week: 2.0 standard drinks    Types: 2 Standard drinks or equivalent per week    Comment: occasional  . Drug use: No     Medication list has been reviewed and updated.  Current Meds  Medication Sig  . estradiol (ESTRACE) 0.5 MG tablet Take 1 tablet (0.5 mg total) by mouth daily. (Patient taking differently: Take 0.5 mg by mouth every other day. )  . levothyroxine (SYNTHROID, LEVOTHROID) 75 MCG tablet Take 1 tablet (75 mcg total) by mouth daily.  . medroxyPROGESTERone (PROVERA) 2.5 MG tablet Take 1 tablet (2.5 mg total) by mouth daily. (Patient taking differently: Take 2.5 mg by mouth every other day. )  . Multiple Vitamins-Minerals (MULTIVITAMIN WITH MINERALS) tablet Take 1 tablet by mouth daily.  . vitamin E 400 UNIT capsule Take 400 Units by mouth daily.    PHQ 2/9 Scores 08/19/2018 08/22/2017 03/23/2015  PHQ - 2 Score 0 0 0  PHQ- 9 Score 0 - -    BP Readings from Last 3 Encounters:  08/19/18 112/60  08/22/17 112/80  03/23/17 112/64    Physical Exam Vitals signs and nursing note reviewed.  Constitutional:      General: She is not in acute distress.    Appearance: Normal appearance. She is well-developed.  HENT:     Head: Normocephalic and atraumatic.     Right Ear: Tympanic membrane and ear canal normal.     Left Ear: Tympanic membrane and  ear canal normal.     Nose:     Right Sinus: No maxillary sinus tenderness or frontal sinus tenderness.     Left Sinus: No maxillary sinus tenderness or frontal sinus tenderness.  Neck:     Musculoskeletal: Normal range of motion and neck supple.     Thyroid: No thyroid mass, thyromegaly or thyroid tenderness.  Cardiovascular:     Rate and Rhythm: Normal rate and regular rhythm.     Pulses: Normal pulses.  Pulmonary:     Effort: Pulmonary effort is normal. No respiratory  distress.     Breath sounds: No wheezing or rhonchi.  Abdominal:     General: Abdomen is flat.     Palpations: Abdomen is soft.     Tenderness: There is no abdominal tenderness.  Musculoskeletal: Normal range of motion.     Right lower leg: No edema.     Left lower leg: No edema.  Lymphadenopathy:     Cervical: No cervical adenopathy.  Skin:    General: Skin is warm and dry.     Capillary Refill: Capillary refill takes less than 2 seconds.     Findings: No rash.  Neurological:     Mental Status: She is alert and oriented to person, place, and time.  Psychiatric:        Behavior: Behavior normal.        Thought Content: Thought content normal.     Wt Readings from Last 3 Encounters:  08/19/18 156 lb (70.8 kg)  08/22/17 152 lb (68.9 kg)  03/23/17 153 lb (69.4 kg)    BP 112/60   Pulse 72   Ht _0  (1.651 m)   Wt 156 lb (70.8 kg)   SpO2 97%   BMI 25.96 kg/m   Assessment and Plan: 1. Hypothyroidism due to acquired atrophy of thyroid Supplemented, change the dose if needed - TSH + free T4  2. Environmental and seasonal allergies Continue zyrtec, add flonase No evidence of sinus infection - CBC with Differential/Platelet  3. Climacteric Continue HRT - Comprehensive metabolic panel  4. Mild hyperlipidemia Check labs - Lipid panel  5. Colon cancer screening Will resend cologuard to her physical address in [place]  6. Encounter for screening mammogram for breast cancer Pt to schedule in [place]; Future   Partially dictated using Editor, commissioning. Any errors are unintentional.  Halina Maidens, MD [facility]  08/19/2018

## 2018-08-19 NOTE — Patient Instructions (Signed)
Begin Flonase nasal spray - 2 sprays in each nostril daily for allergies  Continue Zyrtec

## 2018-08-20 LAB — COMPREHENSIVE METABOLIC PANEL
ALT: 17 IU/L (ref 0–32)
AST: 17 IU/L (ref 0–40)
Albumin/Globulin Ratio: 1.8 (ref 1.2–2.2)
Albumin: 4.6 g/dL (ref 3.8–4.9)
Alkaline Phosphatase: 49 IU/L (ref 39–117)
BUN/Creatinine Ratio: 16 (ref 9–23)
BUN: 17 mg/dL (ref 6–24)
Bilirubin Total: 0.4 mg/dL (ref 0.0–1.2)
CO2: 21 mmol/L (ref 20–29)
Calcium: 9.3 mg/dL (ref 8.7–10.2)
Chloride: 103 mmol/L (ref 96–106)
Creatinine, Ser: 1.04 mg/dL — ABNORMAL HIGH (ref 0.57–1.00)
GFR calc Af Amer: 68 mL/min/{1.73_m2} (ref >59)
GFR calc non Af Amer: 59 mL/min/{1.73_m2} — ABNORMAL LOW (ref >59)
Globulin, Total: 2.6 g/dL (ref 1.5–4.5)
Glucose: 90 mg/dL (ref 65–99)
Potassium: 4.4 mmol/L (ref 3.5–5.2)
Sodium: 143 mmol/L (ref 134–144)
Total Protein: 7.2 g/dL (ref 6.0–8.5)

## 2018-08-20 LAB — LIPID PANEL
Chol/HDL Ratio: 3.9 ratio (ref 0.0–4.4)
Cholesterol, Total: 207 mg/dL — ABNORMAL HIGH (ref 100–199)
HDL: 53 mg/dL (ref >39)
LDL Calculated: 133 mg/dL — ABNORMAL HIGH (ref 0–99)
Triglycerides: 107 mg/dL (ref 0–149)
VLDL Cholesterol Cal: 21 mg/dL (ref 5–40)

## 2018-08-20 LAB — CBC WITH DIFFERENTIAL/PLATELET
Basophils Absolute: 0.1 10*3/uL (ref 0.0–0.2)
Basos: 2 %
EOS (ABSOLUTE): 0.1 10*3/uL (ref 0.0–0.4)
Eos: 2 %
Hematocrit: 37 % (ref 34.0–46.6)
Hemoglobin: 12.5 g/dL (ref 11.1–15.9)
Immature Grans (Abs): 0 10*3/uL (ref 0.0–0.1)
Immature Granulocytes: 0 %
Lymphocytes Absolute: 2 10*3/uL (ref 0.7–3.1)
Lymphs: 37 %
MCH: 30.8 pg (ref 26.6–33.0)
MCHC: 33.8 g/dL (ref 31.5–35.7)
MCV: 91 fL (ref 79–97)
Monocytes Absolute: 0.4 10*3/uL (ref 0.1–0.9)
Monocytes: 8 %
Neutrophils Absolute: 2.7 10*3/uL (ref 1.4–7.0)
Neutrophils: 51 %
Platelets: 255 10*3/uL (ref 150–450)
RBC: 4.06 x10E6/uL (ref 3.77–5.28)
RDW: 12.6 % (ref 11.7–15.4)
WBC: 5.4 10*3/uL (ref 3.4–10.8)

## 2018-08-20 LAB — TSH+FREE T4
Free T4: 1.02 ng/dL (ref 0.82–1.77)
TSH: 0.337 u[IU]/mL — ABNORMAL LOW (ref 0.450–4.500)

## 2018-09-23 ENCOUNTER — Other Ambulatory Visit: Payer: Self-pay

## 2018-09-23 ENCOUNTER — Encounter: Payer: Self-pay | Admitting: Internal Medicine

## 2018-09-23 DIAGNOSIS — E034 Atrophy of thyroid (acquired): Secondary | ICD-10-CM

## 2018-09-23 MED ORDER — LEVOTHYROXINE SODIUM 75 MCG PO TABS: 75.0000 ug | ORAL_TABLET | Freq: Every day | ORAL | 2 refills | Status: DC

## 2019-01-06 ENCOUNTER — Other Ambulatory Visit: Payer: Self-pay | Admitting: Internal Medicine

## 2019-01-06 DIAGNOSIS — E034 Atrophy of thyroid (acquired): Secondary | ICD-10-CM

## 2019-04-23 ENCOUNTER — Telehealth: Payer: Self-pay

## 2019-04-23 ENCOUNTER — Other Ambulatory Visit: Payer: Self-pay | Admitting: Internal Medicine

## 2019-04-23 DIAGNOSIS — Z1231 Encounter for screening mammogram for malignant neoplasm of breast: Secondary | ICD-10-CM

## 2019-04-23 NOTE — Telephone Encounter (Signed)
Pt called saying X 1 month she is having severe migraines. She has had 4 migraines in the last month. She used to have these in the past and does show on her problem list. She has a CPE with you in July and she wants to know if there is anything you can prescribe for her to take just As Needed and not daily for these migraines until she comes to see you in July.  She said they get so severe that it take 5-6 hours to recover from them. She vomits, and she cannot see her vision becomes so blurred.  Please advise? She remembers in the past she took Imitrex. Wants an "as needed medication."  - CM

## 2019-04-23 NOTE — Telephone Encounter (Signed)
Since I have never treated her for migraines, she will need an OV to discuss medication.

## 2019-04-24 NOTE — Telephone Encounter (Signed)
Patient informed and scheduled for Tuesday at 1120.

## 2019-04-29 ENCOUNTER — Ambulatory Visit (INDEPENDENT_AMBULATORY_CARE_PROVIDER_SITE_OTHER): Payer: BC Managed Care – PPO | Admitting: Internal Medicine

## 2019-04-29 ENCOUNTER — Other Ambulatory Visit: Payer: Self-pay

## 2019-04-29 ENCOUNTER — Encounter: Payer: Self-pay | Admitting: Internal Medicine

## 2019-04-29 VITALS — BP 112/84 | HR 73 | Temp 97.3°F | Ht 65.0 in | Wt 155.0 lb

## 2019-04-29 DIAGNOSIS — R0981 Nasal congestion: Secondary | ICD-10-CM | POA: Diagnosis not present

## 2019-04-29 DIAGNOSIS — N951 Menopausal and female climacteric states: Secondary | ICD-10-CM | POA: Diagnosis not present

## 2019-04-29 DIAGNOSIS — G43009 Migraine without aura, not intractable, without status migrainosus: Secondary | ICD-10-CM

## 2019-04-29 NOTE — Progress Notes (Signed)
Date:  04/29/2019   Name:  Courtney Davis   DOB:  [birth year]   MRN:  [id number]   Chief Complaint: Migraine (X1 month wakes her up in the middle of the night sunday had one took 5-6 hours to get over it. Nausea and upset stomach. Being in a dark room helps a little. Had about 5-6 in the past 1.5 months. Severe pain )  Migraine  This is a recurrent problem. Episode onset: started around 2010. Episode frequency: previously once a month but now more often. The pain is located in the frontal region. The quality of the pain is described as throbbing and squeezing. Associated symptoms include nausea and photophobia. Pertinent negatives include no coughing, dizziness, fever, numbness, vomiting or weakness. She has tried NSAIDs and Excedrin for the symptoms. The treatment provided mild relief.  Over the past several years she has had about one headache a month that would respond to otc meds and sleep.  Over the past 6 -8 weeks, she is having more often migraines and more severe.  The otc medication does not work and she can be down for a whole day. She has tried Topamax initially without clear benefit.  Imitrex worked but made her very sleepy.  Nasal congestion - longstanding issues for which she is using Afrin daily.  She has not tried Flonase but had no benefit from Neti pot or saline.  She has never seen ENT.  Menopause sx - stopped her twice weekly HRT due to lack of benefit for sleep or hot flashes.  Now trying an herbal supplement with black cohosh, soy and [employer].  Lab Results  Component Value Date   CREATININE 1.04 (H) 08/19/2018   BUN 17 08/19/2018   NA 143 08/19/2018   K 4.4 08/19/2018   CL 103 08/19/2018   CO2 21 08/19/2018   Lab Results  Component Value Date   CHOL 207 (H) 08/19/2018   HDL 53 08/19/2018   LDLCALC 133 (H) 08/19/2018   TRIG 107 08/19/2018   CHOLHDL 3.9 08/19/2018   Lab Results  Component Value Date   TSH 0.337 (L) 08/19/2018   No results found  for: HGBA1C   Review of Systems  Constitutional: Negative for chills, fatigue, fever and unexpected weight change.  Eyes: Positive for photophobia.  Respiratory: Negative for cough, chest tightness and shortness of breath.   Cardiovascular: Negative for chest pain, palpitations and leg swelling.  Gastrointestinal: Positive for diarrhea and nausea. Negative for constipation and vomiting.  Neurological: Positive for headaches. Negative for dizziness, tremors, weakness, light-headedness and numbness.  Psychiatric/Behavioral: Positive for sleep disturbance.    Patient Active Problem List   Diagnosis Date Noted  . Mild hyperlipidemia 08/19/2018  . Hypothyroidism due to acquired atrophy of thyroid 03/23/2015  . Abnormal cervical Papanicolaou smear 07/25/2014  . Clinical depression 07/25/2014  . Family history of colonic polyps 07/25/2014  . Climacteric 07/25/2014  . Migraine without aura and responsive to treatment 07/25/2014  . Restless leg 07/25/2014    No Known Allergies  Past Surgical History:  Procedure Laterality Date  . CERVICAL ABLATION      Social History   Tobacco Use  . Smoking status: Never Smoker  . Smokeless tobacco: Never Used  Substance Use Topics  . Alcohol use: Yes    Alcohol/week: 2.0 standard drinks    Types: 2 Standard drinks or equivalent per week    Comment: occasional  . Drug use: No     Medication list  has been reviewed and updated.  Current Meds  Medication Sig  . estradiol (ESTRACE) 0.5 MG tablet Take 1 tablet (0.5 mg total) by mouth daily. (Patient taking differently: Take 0.5 mg by mouth 2 (two) times a week. )  . levothyroxine (SYNTHROID) 75 MCG tablet TAKE ONE TABLET BY MOUTH DAILY  . medroxyPROGESTERone (PROVERA) 2.5 MG tablet Take 1 tablet (2.5 mg total) by mouth daily. (Patient taking differently: Take 2.5 mg by mouth every other day. )  . Multiple Vitamins-Minerals (MULTIVITAMIN WITH MINERALS) tablet Take 1 tablet by mouth daily.  .  vitamin E 400 UNIT capsule Take 400 Units by mouth daily.    PHQ 2/9 Scores 04/29/2019 08/19/2018 08/22/2017 03/23/2015  PHQ - 2 Score 0 0 0 0  PHQ- 9 Score 5 0 - -    BP Readings from Last 3 Encounters:  04/29/19 112/84  08/19/18 112/60  08/22/17 112/80    Physical Exam Vitals and nursing note reviewed.  Constitutional:      General: She is not in acute distress.    Appearance: She is well-developed.  HENT:     Head: Normocephalic and atraumatic.  Eyes:     Extraocular Movements: Extraocular movements intact.     Right eye: No nystagmus.     Left eye: No nystagmus.     Conjunctiva/sclera: Conjunctivae normal.  Cardiovascular:     Rate and Rhythm: Normal rate and regular rhythm.     Pulses: Normal pulses.  Pulmonary:     Effort: Pulmonary effort is normal. No respiratory distress.     Breath sounds: No wheezing or rhonchi.  Musculoskeletal:     Cervical back: Normal range of motion and neck supple.     Right lower leg: No edema.     Left lower leg: No edema.  Skin:    General: Skin is warm and dry.     Capillary Refill: Capillary refill takes less than 2 seconds.     Findings: No rash.  Neurological:     General: No focal deficit present.     Mental Status: She is alert and oriented to person, place, and time.     Sensory: Sensation is intact.     Motor: Motor function is intact.     Coordination: Coordination is intact.     Gait: Gait is intact.     Deep Tendon Reflexes:     Reflex Scores:      Bicep reflexes are 2+ on the right side and 2+ on the left side.      Patellar reflexes are 2+ on the right side and 2+ on the left side. Psychiatric:        Behavior: Behavior normal.        Thought Content: Thought content normal.     Wt Readings from Last 3 Encounters:  04/29/19 155 lb (70.3 kg)  08/19/18 156 lb (70.8 kg)  08/22/17 152 lb (68.9 kg)    BP 112/84   Pulse 73   Temp (!) 97.3 F (36.3 C) (Temporal)   Ht 5\' 5"  (1.651 m)   Wt 155 lb (70.3 kg)   SpO2  99%   BMI 25.79 kg/m   Assessment and Plan: 1. Migraine without aura and responsive to treatment Samples of Ubrelvy 50 mg and 100 mg and Nurtec 75 mg given She will try each and call for a prescription of the preferred drug  2. Climacteric Continue herbal supplements  3. Nasal sinus congestion Wean off of Afrin by using Flonase  spray May need to see ENT if symptoms continue   Partially dictated using Dragon software. Any errors are unintentional.  Halina Maidens, MD [facility]  04/29/2019

## 2019-06-04 ENCOUNTER — Telehealth: Payer: Self-pay

## 2019-06-04 NOTE — Telephone Encounter (Signed)
Sent new cologuard order for patient to complete. Hers is about to expire.  CM

## 2019-08-19 ENCOUNTER — Encounter: Payer: BC Managed Care – PPO | Admitting: Internal Medicine

## 2019-08-28 ENCOUNTER — Ambulatory Visit: Payer: Self-pay

## 2019-08-29 ENCOUNTER — Encounter: Payer: BC Managed Care – PPO | Admitting: Internal Medicine

## 2019-09-17 ENCOUNTER — Encounter: Payer: Self-pay | Admitting: Internal Medicine

## 2019-09-17 NOTE — Progress Notes (Signed)
Date:  09/18/2019   Name:  Courtney Davis   DOB:  27-Aug-1960   MRN:  [id number]   Chief Complaint: Annual Exam (Breast Exam. No pap. Fit screen testing for colon cancer screen.)  Courtney Davis is a 59 y.o. female who presents today for her Complete Annual Exam. She feels fairly well. She reports exercising - none. She reports she is sleeping poorly. Breast complaints - none.  Mammogram: today DEXA: none Pap smear: 2019 - neg thin prep Colonoscopy: none (cologuard given last year but not sumitted)  Immunization History  Administered Date(s) Administered  . Influenza,inj,Quad PF,6+ Mos 11/04/2012, 12/09/2013  . Influenza-Unspecified 12/21/2016, 11/20/2017, 11/21/2018    Thyroid Problem Presents for follow-up visit. Symptoms include diaphoresis (night time hot flashes). Patient reports no anxiety, constipation, diarrhea, fatigue, palpitations or tremors. The symptoms have been stable.  Migraine  This is a recurrent problem. The problem occurs intermittently (3-4 times per month). The problem has been unchanged. The pain does not radiate. The pain quality is similar to prior headaches. Associated symptoms include nausea, photophobia and sinus pressure. Pertinent negatives include no abdominal pain, coughing, dizziness, fever, hearing loss, rhinorrhea, tinnitus or vomiting. She has tried beta blockers and triptans (and topiramate) for the symptoms. The treatment provided mild (most recently tried Nurtec with excellent response) relief.  Sinus Problem This is a chronic problem. The problem is unchanged. There has been no fever. Associated symptoms include congestion, diaphoresis (night time hot flashes), headaches and sinus pressure. Pertinent negatives include no chills, coughing or shortness of breath. Past treatments include oral decongestants and spray decongestants (xyzal daily; has to use Afrin to be able to breathe). The treatment provided moderate (symptoms are tolerable in  the daytime but much worse later in the day) relief.  Hot flashes - she is having frequent severe hot flashes and sweats, esp at night that are disrupting her sleep.  She has tried estrogen and all the over the counter products with no benefit.  Most recently taking Ashwagandha with no change.   Lab Results  Component Value Date   CREATININE 1.04 (H) 08/19/2018   BUN 17 08/19/2018   NA 143 08/19/2018   K 4.4 08/19/2018   CL 103 08/19/2018   CO2 21 08/19/2018   Lab Results  Component Value Date   CHOL 207 (H) 08/19/2018   HDL 53 08/19/2018   LDLCALC 133 (H) 08/19/2018   TRIG 107 08/19/2018   CHOLHDL 3.9 08/19/2018   Lab Results  Component Value Date   TSH 0.337 (L) 08/19/2018   No results found for: HGBA1C Lab Results  Component Value Date   WBC 5.4 08/19/2018   HGB 12.5 08/19/2018   HCT 37.0 08/19/2018   MCV 91 08/19/2018   PLT 255 08/19/2018   Lab Results  Component Value Date   ALT 17 08/19/2018   AST 17 08/19/2018   ALKPHOS 49 08/19/2018   BILITOT 0.4 08/19/2018     Review of Systems  Constitutional: Positive for diaphoresis (night time hot flashes). Negative for chills, fatigue, fever and unexpected weight change.  HENT: Positive for congestion and sinus pressure. Negative for hearing loss, postnasal drip, rhinorrhea, tinnitus, trouble swallowing and voice change.   Eyes: Positive for photophobia. Negative for visual disturbance.  Respiratory: Negative for cough, chest tightness, shortness of breath and wheezing.   Cardiovascular: Negative for chest pain, palpitations and leg swelling.  Gastrointestinal: Positive for nausea. Negative for abdominal pain, constipation, diarrhea and vomiting.  Endocrine: Negative for  polydipsia and polyuria.  Genitourinary: Negative for dysuria, frequency, genital sores, vaginal bleeding and vaginal discharge.  Musculoskeletal: Negative for arthralgias, gait problem and joint swelling.  Skin: Negative for color change and rash.         New growth on left arm  Allergic/Immunologic: Negative for environmental allergies.  Neurological: Positive for headaches. Negative for dizziness, tremors and light-headedness.  Hematological: Negative for adenopathy. Does not bruise/bleed easily.  Psychiatric/Behavioral: Positive for sleep disturbance. Negative for dysphoric mood. The patient is not nervous/anxious.     Patient Active Problem List   Diagnosis Date Noted  . Mild hyperlipidemia 08/19/2018  . Hypothyroidism due to acquired atrophy of thyroid 03/23/2015  . Abnormal cervical Papanicolaou smear 07/25/2014  . Family history of colonic polyps 07/25/2014  . Migraine without aura and responsive to treatment 07/25/2014  . Restless leg 07/25/2014    No Known Allergies  Past Surgical History:  Procedure Laterality Date  . CERVICAL ABLATION      Social History   Tobacco Use  . Smoking status: Never Smoker  . Smokeless tobacco: Never Used  Vaping Use  . Vaping Use: Never used  Substance Use Topics  . Alcohol use: Yes    Alcohol/week: 2.0 standard drinks    Types: 2 Standard drinks or equivalent per week    Comment: occasional  . Drug use: No     Medication list has been reviewed and updated.  Current Meds  Medication Sig  . levothyroxine (SYNTHROID) 75 MCG tablet TAKE ONE TABLET BY MOUTH DAILY  . Multiple Vitamins-Minerals (MULTIVITAMIN WITH MINERALS) tablet Take 1 tablet by mouth daily.  . vitamin E 400 UNIT capsule Take 400 Units by mouth daily.    PHQ 2/9 Scores 04/29/2019 08/19/2018 08/22/2017 03/23/2015  PHQ - 2 Score 0 0 0 0  PHQ- 9 Score 5 0 - -    GAD 7 : Generalized Anxiety Score 04/29/2019 08/22/2017  Nervous, Anxious, on Edge 0 0  Control/stop worrying 0 0  Worry too much - different things 0 0  Trouble relaxing 0 0  Restless 0 0  Easily annoyed or irritable 2 0  Afraid - awful might happen 0 0  Total GAD 7 Score 2 0  Anxiety Difficulty Not difficult at all Not difficult at all    BP  Readings from Last 3 Encounters:  09/18/19 126/80  04/29/19 112/84  08/19/18 112/60    Physical Exam Vitals and nursing note reviewed.  Constitutional:      General: She is not in acute distress.    Appearance: She is well-developed.  HENT:     Head: Normocephalic and atraumatic.     Right Ear: Tympanic membrane and ear canal normal.     Left Ear: Tympanic membrane and ear canal normal.     Nose: No nasal deformity.     Right Sinus: No maxillary sinus tenderness or frontal sinus tenderness.     Left Sinus: No maxillary sinus tenderness or frontal sinus tenderness.  Eyes:     General: No scleral icterus.       Right eye: No discharge.        Left eye: No discharge.     Conjunctiva/sclera: Conjunctivae normal.  Neck:     Thyroid: No thyromegaly.     Vascular: No carotid bruit.  Cardiovascular:     Rate and Rhythm: Normal rate and regular rhythm.     Pulses: Normal pulses.     Heart sounds: Normal heart sounds.  Pulmonary:  Effort: Pulmonary effort is normal. No respiratory distress.     Breath sounds: No wheezing.  Abdominal:     General: Bowel sounds are normal.     Palpations: Abdomen is soft.     Tenderness: There is no abdominal tenderness.  Musculoskeletal:        General: Normal range of motion.     Cervical back: Normal range of motion. No erythema.     Right lower leg: No edema.     Left lower leg: No edema.  Lymphadenopathy:     Cervical: No cervical adenopathy.  Skin:    General: Skin is warm and dry.     Capillary Refill: Capillary refill takes less than 2 seconds.     Findings: No rash.       Neurological:     General: No focal deficit present.     Mental Status: She is alert and oriented to person, place, and time.     Cranial Nerves: No cranial nerve deficit.     Sensory: No sensory deficit.     Motor: No weakness.     Deep Tendon Reflexes: Reflexes are normal and symmetric.  Psychiatric:        Attention and Perception: Attention normal.         Mood and Affect: Mood normal.        Behavior: Behavior normal.        Thought Content: Thought content normal.     Wt Readings from Last 3 Encounters:  09/18/19 162 lb (73.5 kg)  04/29/19 155 lb (70.3 kg)  08/19/18 156 lb (70.8 kg)    BP 126/80   Pulse 90   Temp 98 F (36.7 C) (Oral)   Ht 5\' 5"  (1.651 m)   Wt 162 lb (73.5 kg)   SpO2 98%   BMI 26.96 kg/m   Assessment and Plan: 1. Annual physical exam Normal exam Continue exercise, healthy diet - CBC with Differential/Platelet - Comprehensive metabolic panel - Lipid panel - POCT urinalysis dipstick  2. Colon cancer screening She and her neighbors have issues with receiving mail - has not gotten the last 2 cologuard orders - Fecal occult blood, imunochemical  3. Encounter for screening mammogram for breast cancer Done this AM - result pending  4. Hypothyroidism due to acquired atrophy of thyroid supplemented - TSH+T4F+T3Free  5. Menopausal hot flushes Has not benefit from OTC herbals products and does not want to take HRT Will try low dose SNRI - venlafaxine XR (EFFEXOR-XR) 37.5 MG 24 hr capsule; Take 1 capsule (37.5 mg total) by mouth daily with breakfast.  Dispense: 90 capsule; Refill: 1  6. Neoplasm of uncertain behavior of skin Recommend Dermatology evaluation  7. Environmental and seasonal allergies Chronic daily symptoms only relieved by Afrin spray Continue Flonase and Xyzal daily Consider Allergy referral and testing - pt declines at this time  8. Migraine without aura and responsive to treatment Recurrent disabling migraines at times responsive to abortive therapy with Nurtec Failed trials of beta blocker, topiramate and imitrex Samples and Rx of Nurtec given  Partially dictated using Editor, commissioning. Any errors are unintentional.  Halina Maidens, MD [facility]  09/18/2019

## 2019-09-18 ENCOUNTER — Other Ambulatory Visit: Payer: Self-pay

## 2019-09-18 ENCOUNTER — Ambulatory Visit (INDEPENDENT_AMBULATORY_CARE_PROVIDER_SITE_OTHER): Payer: BC Managed Care – PPO | Admitting: Internal Medicine

## 2019-09-18 ENCOUNTER — Ambulatory Visit
Admission: RE | Admit: 2019-09-18 | Discharge: 2019-09-18 | Disposition: A | Discharge status: Home / Self Care | Payer: BC Managed Care – PPO | Source: Ambulatory Visit | Attending: Internal Medicine | Admitting: Internal Medicine

## 2019-09-18 ENCOUNTER — Encounter: Payer: Self-pay | Admitting: Internal Medicine

## 2019-09-18 VITALS — BP 126/80 | HR 90 | Temp 98.0°F | Ht 65.0 in | Wt 162.0 lb

## 2019-09-18 DIAGNOSIS — D485 Neoplasm of uncertain behavior of skin: Secondary | ICD-10-CM | POA: Diagnosis not present

## 2019-09-18 DIAGNOSIS — Z1211 Encounter for screening for malignant neoplasm of colon: Secondary | ICD-10-CM

## 2019-09-18 DIAGNOSIS — J3089 Other allergic rhinitis: Secondary | ICD-10-CM

## 2019-09-18 DIAGNOSIS — Z0000 Encounter for general adult medical examination without abnormal findings: Secondary | ICD-10-CM

## 2019-09-18 DIAGNOSIS — Z1231 Encounter for screening mammogram for malignant neoplasm of breast: Secondary | ICD-10-CM | POA: Insufficient documentation

## 2019-09-18 DIAGNOSIS — N951 Menopausal and female climacteric states: Secondary | ICD-10-CM

## 2019-09-18 DIAGNOSIS — E034 Atrophy of thyroid (acquired): Secondary | ICD-10-CM | POA: Diagnosis not present

## 2019-09-18 DIAGNOSIS — G43009 Migraine without aura, not intractable, without status migrainosus: Secondary | ICD-10-CM

## 2019-09-18 LAB — POCT URINALYSIS DIPSTICK
Bilirubin, UA: NEGATIVE
Blood, UA: NEGATIVE
Glucose, UA: NEGATIVE
Ketones, UA: NEGATIVE
Leukocytes, UA: NEGATIVE
Nitrite, UA: NEGATIVE
Protein, UA: NEGATIVE
Spec Grav, UA: 1.02 (ref 1.010–1.025)
Urobilinogen, UA: 0.2 E.U./dL
pH, UA: 5 (ref 5.0–8.0)

## 2019-09-18 MED ORDER — VENLAFAXINE HCL ER 37.5 MG PO CP24: 37.5000 mg | ORAL_CAPSULE | Freq: Every day | ORAL | 1 refills | Status: DC

## 2019-09-18 MED ORDER — NURTEC 75 MG PO TBDP: 1.0000 | ORAL_TABLET | Freq: Every day | ORAL | 0 refills | Status: DC | PRN

## 2019-09-18 NOTE — Patient Instructions (Signed)
Schedule an appointment with a Dermatologist

## 2019-09-19 LAB — CBC WITH DIFFERENTIAL/PLATELET
Basophils Absolute: 0.1 10*3/uL (ref 0.0–0.2)
Basos: 2 %
EOS (ABSOLUTE): 0.1 10*3/uL (ref 0.0–0.4)
Eos: 3 %
Hematocrit: 39.1 % (ref 34.0–46.6)
Hemoglobin: 13 g/dL (ref 11.1–15.9)
Immature Grans (Abs): 0 10*3/uL (ref 0.0–0.1)
Immature Granulocytes: 0 %
Lymphocytes Absolute: 1.8 10*3/uL (ref 0.7–3.1)
Lymphs: 43 %
MCH: 30.2 pg (ref 26.6–33.0)
MCHC: 33.2 g/dL (ref 31.5–35.7)
MCV: 91 fL (ref 79–97)
Monocytes Absolute: 0.5 10*3/uL (ref 0.1–0.9)
Monocytes: 11 %
Neutrophils Absolute: 1.7 10*3/uL (ref 1.4–7.0)
Neutrophils: 41 %
Platelets: 242 10*3/uL (ref 150–450)
RBC: 4.31 x10E6/uL (ref 3.77–5.28)
RDW: 12.2 % (ref 11.7–15.4)
WBC: 4.2 10*3/uL (ref 3.4–10.8)

## 2019-09-19 LAB — COMPREHENSIVE METABOLIC PANEL
ALT: 17 IU/L (ref 0–32)
AST: 15 IU/L (ref 0–40)
Albumin/Globulin Ratio: 1.8 (ref 1.2–2.2)
Albumin: 4.5 g/dL (ref 3.8–4.9)
Alkaline Phosphatase: 58 IU/L (ref 48–121)
BUN/Creatinine Ratio: 24 — ABNORMAL HIGH (ref 9–23)
BUN: 24 mg/dL (ref 6–24)
Bilirubin Total: 0.3 mg/dL (ref 0.0–1.2)
CO2: 22 mmol/L (ref 20–29)
Calcium: 9.5 mg/dL (ref 8.7–10.2)
Chloride: 109 mmol/L — ABNORMAL HIGH (ref 96–106)
Creatinine, Ser: 0.98 mg/dL (ref 0.57–1.00)
GFR calc Af Amer: 73 mL/min/{1.73_m2} (ref >59)
GFR calc non Af Amer: 63 mL/min/{1.73_m2} (ref >59)
Globulin, Total: 2.5 g/dL (ref 1.5–4.5)
Glucose: 84 mg/dL (ref 65–99)
Potassium: 4.7 mmol/L (ref 3.5–5.2)
Sodium: 144 mmol/L (ref 134–144)
Total Protein: 7 g/dL (ref 6.0–8.5)

## 2019-09-19 LAB — TSH+T4F+T3FREE
Free T4: 1.4 ng/dL (ref 0.82–1.77)
T3, Free: 3.1 pg/mL (ref 2.0–4.4)
TSH: 0.184 u[IU]/mL — ABNORMAL LOW (ref 0.450–4.500)

## 2019-09-19 LAB — LIPID PANEL
Chol/HDL Ratio: 4 ratio (ref 0.0–4.4)
Cholesterol, Total: 214 mg/dL — ABNORMAL HIGH (ref 100–199)
HDL: 53 mg/dL (ref >39)
LDL Chol Calc (NIH): 151 mg/dL — ABNORMAL HIGH (ref 0–99)
Triglycerides: 58 mg/dL (ref 0–149)
VLDL Cholesterol Cal: 10 mg/dL (ref 5–40)

## 2019-09-23 ENCOUNTER — Encounter: Payer: Self-pay | Admitting: Internal Medicine

## 2019-09-29 ENCOUNTER — Other Ambulatory Visit: Payer: Self-pay | Admitting: Internal Medicine

## 2019-09-29 DIAGNOSIS — G43009 Migraine without aura, not intractable, without status migrainosus: Secondary | ICD-10-CM

## 2019-09-29 MED ORDER — RIZATRIPTAN BENZOATE 10 MG PO TBDP: 10.0000 mg | ORAL_TABLET | ORAL | 0 refills | Status: DC | PRN

## 2019-09-30 ENCOUNTER — Other Ambulatory Visit: Payer: Self-pay

## 2019-09-30 DIAGNOSIS — G43009 Migraine without aura, not intractable, without status migrainosus: Secondary | ICD-10-CM

## 2019-09-30 MED ORDER — RIZATRIPTAN BENZOATE 10 MG PO TBDP: 10.0000 mg | ORAL_TABLET | ORAL | 0 refills | Status: DC | PRN

## 2019-10-09 ENCOUNTER — Telehealth: Payer: Self-pay | Admitting: Internal Medicine

## 2019-10-09 NOTE — Telephone Encounter (Signed)
Patient was DENIED for nurtec per her insurance. She is now taking rizatriptan in place of this to try.  CM

## 2019-10-09 NOTE — Telephone Encounter (Signed)
Courtney Davis is calling to check on the status of appeal form for nurtec

## 2020-01-23 ENCOUNTER — Other Ambulatory Visit: Payer: Self-pay | Admitting: Internal Medicine

## 2020-01-23 ENCOUNTER — Telehealth: Payer: Self-pay | Admitting: Internal Medicine

## 2020-01-23 DIAGNOSIS — E034 Atrophy of thyroid (acquired): Secondary | ICD-10-CM

## 2020-01-23 MED ORDER — LEVOTHYROXINE SODIUM 75 MCG PO TABS: 75.0000 ug | ORAL_TABLET | Freq: Every day | ORAL | 2 refills | Status: DC

## 2020-01-23 NOTE — Telephone Encounter (Signed)
Medication Refill - Medication: levothyroxine (SYNTHROID) 75 MCG tablet   Has the patient contacted their pharmacy?yes (Agent: If no, request that the patient contact the pharmacy for the refill.) (Agent: If yes, when and what did the pharmacy advise?)Contact PCP  Preferred Pharmacy (with phone number or street name): [facility] [place], [place] Phone:  [phone]  Fax:  [phone]       Agent: Please be advised that RX refills may take up to 3 business days. We ask that you follow-up with your pharmacy.

## 2020-06-18 ENCOUNTER — Encounter: Payer: Self-pay | Admitting: Internal Medicine

## 2020-06-18 ENCOUNTER — Telehealth (INDEPENDENT_AMBULATORY_CARE_PROVIDER_SITE_OTHER): Payer: BC Managed Care – PPO | Admitting: Internal Medicine

## 2020-06-18 ENCOUNTER — Other Ambulatory Visit: Payer: Self-pay

## 2020-06-18 DIAGNOSIS — G43009 Migraine without aura, not intractable, without status migrainosus: Secondary | ICD-10-CM

## 2020-06-18 MED ORDER — NURTEC 75 MG PO TBDP: 1.0000 | ORAL_TABLET | Freq: Every day | ORAL | 0 refills | Status: DC | PRN

## 2020-06-18 MED ORDER — RIZATRIPTAN BENZOATE 10 MG PO TBDP: 10.0000 mg | ORAL_TABLET | ORAL | 0 refills | Status: DC | PRN

## 2020-06-18 NOTE — Progress Notes (Addendum)
Date:  06/18/2020   Name:  Courtney Davis   DOB:  1960/07/20   MRN:  [id number]  I connected with this patient, Courtney Davis, by telephone at the patient's home.  I verified that I am speaking with the correct person using two identifiers. This visit was conducted via telephone due to the Covid-19 outbreak from my office at [facility] in [place], [place]. I discussed the limitations, risks, security and privacy concerns of performing an evaluation and management service by telephone. I also discussed with the patient that there may be a patient responsible charge related to this service. The patient expressed understanding and agreed to proceed.  Chief Complaint: Migraine (Lasted 3 days. Feeling better now. Took migraine meds given at last visit. Needs refills on migraine meds. )  Migraine  This is a recurrent problem. The problem occurs intermittently (several times a month). The problem has been unchanged. The pain is located in the occipital region. The pain radiates to the face. The pain quality is similar to prior headaches. The quality of the pain is described as pulsating and throbbing. Associated symptoms include nausea, photophobia and vomiting. Pertinent negatives include no dizziness or fever. Nothing aggravates the symptoms. She has tried triptans (rizatriptan and sumatriptan with minimal benefit) for the symptoms.    Lab Results  Component Value Date   CREATININE 0.98 09/18/2019   BUN 24 09/18/2019   NA 144 09/18/2019   K 4.7 09/18/2019   CL 109 (H) 09/18/2019   CO2 22 09/18/2019   Lab Results  Component Value Date   CHOL 214 (H) 09/18/2019   HDL 53 09/18/2019   LDLCALC 151 (H) 09/18/2019   TRIG 58 09/18/2019   CHOLHDL 4.0 09/18/2019   Lab Results  Component Value Date   TSH 0.184 (L) 09/18/2019   No results found for: HGBA1C Lab Results  Component Value Date   WBC 4.2 09/18/2019   HGB 13.0 09/18/2019   HCT 39.1 09/18/2019   MCV 91 09/18/2019   PLT  242 09/18/2019   Lab Results  Component Value Date   ALT 17 09/18/2019   AST 15 09/18/2019   ALKPHOS 58 09/18/2019   BILITOT 0.3 09/18/2019     Review of Systems  Constitutional: Negative for chills, fatigue and fever.  Eyes: Positive for photophobia.  Respiratory: Negative for chest tightness and shortness of breath.   Cardiovascular: Negative for chest pain.  Gastrointestinal: Positive for nausea and vomiting.  Neurological: Positive for headaches. Negative for dizziness and light-headedness.    Patient Active Problem List   Diagnosis Date Noted  . Mild hyperlipidemia 08/19/2018  . Hypothyroidism due to acquired atrophy of thyroid 03/23/2015  . Abnormal cervical Papanicolaou smear 07/25/2014  . Family history of colonic polyps 07/25/2014  . Migraine without aura and responsive to treatment 07/25/2014  . Restless leg 07/25/2014    No Known Allergies  Past Surgical History:  Procedure Laterality Date  . CERVICAL ABLATION      Social History   Tobacco Use  . Smoking status: Never Smoker  . Smokeless tobacco: Never Used  Vaping Use  . Vaping Use: Never used  Substance Use Topics  . Alcohol use: Yes    Alcohol/week: 2.0 standard drinks    Types: 2 Standard drinks or equivalent per week    Comment: occasional  . Drug use: No     Medication list has been reviewed and updated.  Current Meds  Medication Sig  . levothyroxine (SYNTHROID) 75 MCG  tablet Take 1 tablet (75 mcg total) by mouth daily.  . Multiple Vitamins-Minerals (MULTIVITAMIN WITH MINERALS) tablet Take 1 tablet by mouth daily.  . vitamin E 400 UNIT capsule Take 400 Units by mouth daily.  . [DISCONTINUED] rizatriptan (MAXALT-MLT) 10 MG disintegrating tablet Take 1 tablet (10 mg total) by mouth as needed for migraine. May repeat in 2 hours if needed (Patient taking differently: Take 20 mg by mouth as needed for migraine. May repeat in 2 hours if needed)  . [DISCONTINUED] venlafaxine XR (EFFEXOR-XR) 37.5  MG 24 hr capsule Take 1 capsule (37.5 mg total) by mouth daily with breakfast.    PHQ 2/9 Scores 06/18/2020 04/29/2019 08/19/2018 08/22/2017  PHQ - 2 Score 0 0 0 0  PHQ- 9 Score 0 5 0 -    GAD 7 : Generalized Anxiety Score 06/18/2020 04/29/2019 08/22/2017  Nervous, Anxious, on Edge 0 0 0  Control/stop worrying 0 0 0  Worry too much - different things 0 0 0  Trouble relaxing 0 0 0  Restless 0 0 0  Easily annoyed or irritable 0 2 0  Afraid - awful might happen 0 0 0  Total GAD 7 Score 0 2 0  Anxiety Difficulty Not difficult at all Not difficult at all Not difficult at all    BP Readings from Last 3 Encounters:  09/18/19 126/80  04/29/19 112/84  08/19/18 112/60    Physical Exam Pulmonary:     Effort: Pulmonary effort is normal.  Neurological:     Mental Status: She is alert.  Psychiatric:        Attention and Perception: Attention normal.        Mood and Affect: Mood normal.        Speech: Speech normal.        Behavior: Behavior normal.        Cognition and Memory: Cognition normal.     Wt Readings from Last 3 Encounters:  09/18/19 162 lb (73.5 kg)  04/29/19 155 lb (70.3 kg)  08/19/18 156 lb (70.8 kg)    There were no vitals taken for this visit.  Assessment and Plan: 1. Migraine without aura and responsive to treatment She has effectively failed Maxalt since she always has to take 2 tabs. She previously failed PRN sumatriptan.  She also had no benefit from Inderal and Topamax for prevention. She will likely meet criteria for her insurance to cover Nurtec. - rizatriptan (MAXALT-MLT) 10 MG disintegrating tablet; Take 1 tablet (10 mg total) by mouth as needed for migraine. May repeat in 2 hours if needed  Dispense: 10 tablet; Refill: 0 - Rimegepant Sulfate (NURTEC) 75 MG TBDP; Take 1 tablet by mouth daily as needed.  Dispense: 10 tablet; Refill: 0  I spent 12 minutes on this encounter. Partially dictated using Editor, commissioning. Any errors are unintentional.  Courtney Maidens,  MD [facility]  06/18/2020

## 2020-06-22 ENCOUNTER — Telehealth: Payer: Self-pay

## 2020-06-22 NOTE — Telephone Encounter (Signed)
Completed PA on [email or web address] for Nurtec 75mg  tab. 10 tablets for 30 days.   Waiting for outcome of PA.  (Key: [id number])

## 2020-06-23 ENCOUNTER — Telehealth: Payer: Self-pay

## 2020-06-23 ENCOUNTER — Other Ambulatory Visit: Payer: Self-pay

## 2020-06-23 DIAGNOSIS — G43009 Migraine without aura, not intractable, without status migrainosus: Secondary | ICD-10-CM

## 2020-06-23 MED ORDER — RIZATRIPTAN BENZOATE 10 MG PO TBDP: 10.0000 mg | ORAL_TABLET | ORAL | 0 refills | Status: DC | PRN

## 2020-06-23 NOTE — Telephone Encounter (Signed)
Copied from [place] [phone]. Topic: General - Other >> Jun 23, 2020 10:57 AM Pawlus, Brayton Layman A wrote: Reason for CRM: [employer] was calling in to request a renewal of rizatriptan (MAXALT-MLT) 10 MG disintegrating tablet, please advise.

## 2020-06-23 NOTE — Telephone Encounter (Signed)
Sent to [facility].

## 2020-06-28 ENCOUNTER — Encounter: Payer: Self-pay | Admitting: Internal Medicine

## 2020-06-28 NOTE — Telephone Encounter (Signed)
PA has been approved and patient informed. FAXED the approval to patients [place] so they will fill medication for patient.

## 2020-06-28 NOTE — Telephone Encounter (Signed)
PA approved. Patient informed. Called pharmacy and they are also informed- [facility].

## 2020-06-28 NOTE — Telephone Encounter (Signed)
Best contact: [phone]  Reference # Lillia Carmel called to share this info, if the office has any questions please advise

## 2020-06-28 NOTE — Telephone Encounter (Signed)
Faxed [employer] patients last office visit note that's states patient has tried and failed sumatriptan and rizatriptan.

## 2020-09-10 ENCOUNTER — Encounter: Payer: Self-pay | Admitting: Internal Medicine

## 2020-09-13 ENCOUNTER — Other Ambulatory Visit: Payer: Self-pay | Admitting: Internal Medicine

## 2020-09-13 DIAGNOSIS — E034 Atrophy of thyroid (acquired): Secondary | ICD-10-CM

## 2020-09-13 NOTE — Telephone Encounter (Signed)
   Notes to clinic:  Patient ha upcoming appt on 11/23/2020 Review for refill until that time Due for labs  Requested Prescriptions  Pending Prescriptions Disp Refills   levothyroxine (SYNTHROID) 75 MCG tablet [Pharmacy Med Name: Levothyroxine Sodium 60mg Tablet] 90 tablet 1    Sig: Take 1 tablet by mouth daily.      Endocrinology:  Hypothyroid Agents Failed - 09/13/2020  2:28 PM      Failed - TSH needs to be rechecked within 3 months after an abnormal result. Refill until TSH is due.      Failed - TSH in normal range and within 360 days    TSH  Date Value Ref Range Status  09/18/2019 0.184 (L) 0.450 - 4.500 uIU/mL Final          Passed - Valid encounter within last 12 months    Recent Outpatient Visits           2 months [facility]   12 months ago [facility] aura and [facility]   2 [facility] to [facility]   3 [facility]       Future Appointments             In 2 months [facility]Jesse [facility]

## 2020-09-16 ENCOUNTER — Other Ambulatory Visit: Payer: Self-pay | Admitting: Internal Medicine

## 2020-09-16 DIAGNOSIS — E034 Atrophy of thyroid (acquired): Secondary | ICD-10-CM

## 2020-09-16 NOTE — Telephone Encounter (Signed)
Requested medication (s) are due for refill today:   Yes  Requested medication (s) are on the active medication list:   Yes  Future visit scheduled:   Yes   Last ordered: 01/23/2020 #90, 2 refills  Returned because she has an appt on 11/23/2020.  Review for refill until that time.   Labs also due.  Requested Prescriptions  Pending Prescriptions Disp Refills   levothyroxine (SYNTHROID) 75 MCG tablet [Pharmacy Med Name: Levothyroxine Sodium 68mg Tablet] 90 tablet 1    Sig: Take 1 tablet by mouth daily.      Endocrinology:  Hypothyroid Agents Failed - 09/16/2020 10:35 AM      Failed - TSH needs to be rechecked within 3 months after an abnormal result. Refill until TSH is due.      Failed - TSH in normal range and within 360 days    TSH  Date Value Ref Range Status  09/18/2019 0.184 (L) 0.450 - 4.500 uIU/mL Final          Passed - Valid encounter within last 12 months    Recent Outpatient Visits           3 months [facility]   12 months ago [facility]   1 year [facility]raine [facility] aura and [facility]   2 years ago [facility]othyroidism [facility] to acquired atrophy of thyroid   [facility]   3 [facility]       Future Appointments             In 2 months BArmy Mel[facility]LJesse Sans [facility]

## 2020-10-14 ENCOUNTER — Other Ambulatory Visit: Payer: Self-pay | Admitting: Internal Medicine

## 2020-10-14 DIAGNOSIS — G43009 Migraine without aura, not intractable, without status migrainosus: Secondary | ICD-10-CM

## 2020-10-18 ENCOUNTER — Other Ambulatory Visit: Payer: Self-pay

## 2020-10-18 DIAGNOSIS — G43009 Migraine without aura, not intractable, without status migrainosus: Secondary | ICD-10-CM

## 2020-10-18 MED ORDER — RIZATRIPTAN BENZOATE 10 MG PO TBDP: ORAL_TABLET | ORAL | 0 refills | Status: DC

## 2020-10-19 ENCOUNTER — Encounter: Payer: Self-pay | Admitting: Internal Medicine

## 2020-10-19 ENCOUNTER — Other Ambulatory Visit: Payer: Self-pay

## 2020-10-19 DIAGNOSIS — E034 Atrophy of thyroid (acquired): Secondary | ICD-10-CM

## 2020-10-19 MED ORDER — LEVOTHYROXINE SODIUM 75 MCG PO TABS: 75.0000 ug | ORAL_TABLET | Freq: Every day | ORAL | 1 refills | Status: DC

## 2020-11-23 ENCOUNTER — Encounter: Payer: BC Managed Care – PPO | Admitting: Internal Medicine

## 2021-03-02 ENCOUNTER — Encounter: Payer: BC Managed Care – PPO | Admitting: Internal Medicine

## 2021-05-31 ENCOUNTER — Ambulatory Visit (INDEPENDENT_AMBULATORY_CARE_PROVIDER_SITE_OTHER): Payer: BC Managed Care – PPO | Admitting: Internal Medicine

## 2021-05-31 ENCOUNTER — Other Ambulatory Visit (HOSPITAL_COMMUNITY)
Admission: RE | Admit: 2021-05-31 | Discharge: 2021-05-31 | Disposition: A | Discharge status: Home / Self Care | Payer: BC Managed Care – PPO | Source: Ambulatory Visit | Attending: Internal Medicine | Admitting: Internal Medicine

## 2021-05-31 ENCOUNTER — Encounter: Payer: Self-pay | Admitting: Internal Medicine

## 2021-05-31 VITALS — BP 118/82 | HR 88 | Ht 65.0 in | Wt 168.0 lb

## 2021-05-31 DIAGNOSIS — Z124 Encounter for screening for malignant neoplasm of cervix: Secondary | ICD-10-CM | POA: Diagnosis not present

## 2021-05-31 DIAGNOSIS — E034 Atrophy of thyroid (acquired): Secondary | ICD-10-CM

## 2021-05-31 DIAGNOSIS — Z1231 Encounter for screening mammogram for malignant neoplasm of breast: Secondary | ICD-10-CM

## 2021-05-31 DIAGNOSIS — Z1211 Encounter for screening for malignant neoplasm of colon: Secondary | ICD-10-CM | POA: Diagnosis not present

## 2021-05-31 DIAGNOSIS — Z0000 Encounter for general adult medical examination without abnormal findings: Secondary | ICD-10-CM | POA: Diagnosis not present

## 2021-05-31 DIAGNOSIS — E785 Hyperlipidemia, unspecified: Secondary | ICD-10-CM | POA: Diagnosis not present

## 2021-05-31 DIAGNOSIS — G43009 Migraine without aura, not intractable, without status migrainosus: Secondary | ICD-10-CM

## 2021-05-31 MED ORDER — NURTEC 75 MG PO TBDP
1.0000 | ORAL_TABLET | Freq: Every day | ORAL | 12 refills | Status: AC | PRN
Start: 2021-05-31 — End: ?

## 2021-05-31 MED ORDER — RIZATRIPTAN BENZOATE 10 MG PO TBDP: ORAL_TABLET | ORAL | 0 refills | Status: DC

## 2021-05-31 MED ORDER — LEVOTHYROXINE SODIUM 75 MCG PO TABS: 75.0000 ug | ORAL_TABLET | Freq: Every day | ORAL | 1 refills | Status: DC

## 2021-05-31 NOTE — Progress Notes (Signed)
? ? ?Date:  05/31/2021  ? ?Name:  Courtney Davis   DOB:  1960-09-10   MRN:  [id number] ? ? ?Chief Complaint: Annual Exam (Breast exam with pap ) ?Courtney Davis is a 61 y.o. female who presents today for her Complete Annual Exam. She feels well. She reports exercising some. She reports she is sleeping well. Breast complaints none. ? ?Mammogram: 08/2019 ?DEXA: none ?Pap smear: 08/2017 normal thin prep - due ?Colonoscopy: Cologuard 2020 not done/ FIT 2021 not done ? ?Health Maintenance Due  ?Topic Date Due  ?? TETANUS/TDAP  Never done  ?? Zoster Vaccines- Shingrix (1 of 2) Never done  ?? COLONOSCOPY (Pts 45-67yr Insurance coverage will need to be confirmed)  Never done  ?? PAP SMEAR-Modifier  08/22/2020  ?? MAMMOGRAM  09/17/2020  ?  ?Immunization History  ?Administered Date(s) Administered  ?? Influenza,inj,Quad PF,6+ Mos 11/04/2012, 12/09/2013  ?? Influenza-Unspecified 12/21/2016, 11/20/2017, 11/21/2018  ?? PFIZER Comirnaty(Gray Top)Covid-19 Tri-Sucrose Vaccine 05/07/2019, 05/28/2019, 01/26/2020, 12/17/2020  ? ? ?Thyroid Problem ?Presents for follow-up visit. Patient reports no anxiety, constipation, diarrhea, fatigue, palpitations or tremors. The symptoms have been stable.  ?Migraine  ?This is a recurrent problem. Pertinent negatives include no abdominal pain, coughing, dizziness, fever, hearing loss, tinnitus or vomiting. Treatments tried: taking Nurtec.  ? ?Lab Results  ?Component Value Date  ? NA 144 09/18/2019  ? K 4.7 09/18/2019  ? CO2 22 09/18/2019  ? GLUCOSE 84 09/18/2019  ? BUN 24 09/18/2019  ? CREATININE 0.98 09/18/2019  ? CALCIUM 9.5 09/18/2019  ? GFRNONAA 63 09/18/2019  ? ?Lab Results  ?Component Value Date  ? CHOL 214 (H) 09/18/2019  ? HDL 53 09/18/2019  ? LDLCALC 151 (H) 09/18/2019  ? TRIG 58 09/18/2019  ? CHOLHDL 4.0 09/18/2019  ? ?Lab Results  ?Component Value Date  ? TSH 0.184 (L) 09/18/2019  ? ?No results found for: HGBA1C ?Lab Results  ?Component Value Date  ? WBC 4.2 09/18/2019  ? HGB 13.0  09/18/2019  ? HCT 39.1 09/18/2019  ? MCV 91 09/18/2019  ? PLT 242 09/18/2019  ? ?Lab Results  ?Component Value Date  ? ALT 17 09/18/2019  ? AST 15 09/18/2019  ? ALKPHOS 58 09/18/2019  ? BILITOT 0.3 09/18/2019  ? ?No results found for: 25OHVITD2, 2[place] VD25OH  ? ?Review of Systems  ?Constitutional:  Negative for chills, fatigue and fever.  ?HENT:  Negative for congestion, hearing loss, tinnitus, trouble swallowing and voice change.   ?Eyes:  Negative for visual disturbance.  ?Respiratory:  Negative for cough, chest tightness, shortness of breath and wheezing.   ?Cardiovascular:  Negative for chest pain, palpitations and leg swelling.  ?Gastrointestinal:  Negative for abdominal pain, constipation, diarrhea and vomiting.  ?Endocrine: Negative for polydipsia and polyuria.  ?Genitourinary:  Negative for dysuria, frequency, genital sores, vaginal bleeding and vaginal discharge.  ?Musculoskeletal:  Negative for arthralgias, gait problem and joint swelling.  ?Skin:  Negative for color change and rash.  ?Neurological:  Positive for headaches. Negative for dizziness, tremors and light-headedness.  ?Hematological:  Negative for adenopathy. Does not bruise/bleed easily.  ?Psychiatric/Behavioral:  Negative for dysphoric mood and sleep disturbance. The patient is not nervous/anxious.   ? ?Patient Active Problem List  ? Diagnosis Date Noted  ?? Mild hyperlipidemia 08/19/2018  ?? Hypothyroidism due to acquired atrophy of thyroid 03/23/2015  ?? Abnormal cervical Papanicolaou smear 07/25/2014  ?? Family history of colonic polyps 07/25/2014  ?? Migraine without aura and responsive to treatment 07/25/2014  ?? Restless leg 07/25/2014  ? ? ?  No Known Allergies ? ?Past Surgical History:  ?Procedure Laterality Date  ?? CERVICAL ABLATION    ? ? ?Social History  ? ?Tobacco Use  ?? Smoking status: Never  ?? Smokeless tobacco: Never  ?Vaping Use  ?? Vaping Use: Never used  ?Substance Use Topics  ?? Alcohol use: Yes  ?  Alcohol/week: 2.0  standard drinks  ?  Types: 2 Standard drinks or equivalent per week  ?  Comment: occasional  ?? Drug use: No  ? ? ? ?Medication list has been reviewed and updated. ? ?Current Meds  ?Medication Sig  ?? levothyroxine (SYNTHROID) 75 MCG tablet Take 1 tablet (75 mcg total) by mouth daily.  ?? Multiple Vitamins-Minerals (MULTIVITAMIN WITH MINERALS) tablet Take 1 tablet by mouth daily.  ?? Rimegepant Sulfate (NURTEC) 75 MG TBDP Take 1 tablet by mouth daily as needed.  ?? rizatriptan (MAXALT-MLT) 10 MG disintegrating tablet Dissolve 1 tablet (10 mg total) by mouth as needed for migraine. May repeat in 2 hours if needed. Do not exceed more than 30 mg in a 24 hour period.  ?? vitamin E 400 UNIT capsule Take 400 Units by mouth daily.  ? ? ? ?  05/31/2021  ?  8:44 AM 06/18/2020  ? 11:18 AM 04/29/2019  ? 11:20 AM 08/22/2017  ? 10:48 AM  ?GAD 7 : Generalized Anxiety Score  ?Nervous, Anxious, on Edge 0 0 0 0  ?Control/stop worrying 0 0 0 0  ?Worry too much - different things 0 0 0 0  ?Trouble relaxing 0 0 0 0  ?Restless 0 0 0 0  ?Easily annoyed or irritable 1 0 2 0  ?Afraid - awful might happen 0 0 0 0  ?Total GAD 7 Score 1 0 2 0  ?Anxiety Difficulty Not difficult at all Not difficult at all Not difficult at all Not difficult at all  ? ? ? ?  05/31/2021  ?  8:43 AM  ?Depression screen PHQ 2/9  ?Decreased Interest 0  ?Down, Depressed, Hopeless 0  ?PHQ - 2 Score 0  ?Altered sleeping 1  ?Tired, decreased energy 1  ?Change in appetite 0  ?Feeling bad or failure about yourself  0  ?Trouble concentrating 0  ?Moving slowly or fidgety/restless 0  ?Suicidal thoughts 0  ?PHQ-9 Score 2  ?Difficult doing work/chores Not difficult at all  ? ? ?BP Readings from Last 3 Encounters:  ?05/31/21 118/82  ?09/18/19 126/80  ?04/29/19 112/84  ? ? ?Physical Exam ?Vitals and nursing note reviewed.  ?Constitutional:   ?   General: She is not in acute distress. ?   Appearance: She is well-developed.  ?HENT:  ?   Head: Normocephalic and atraumatic.  ?   Right Ear:  Tympanic membrane and ear canal normal.  ?   Left Ear: Tympanic membrane and ear canal normal.  ?   Nose:  ?   Right Sinus: No maxillary sinus tenderness.  ?   Left Sinus: No maxillary sinus tenderness.  ?Eyes:  ?   General: No scleral icterus.    ?   Right eye: No discharge.     ?   Left eye: No discharge.  ?   Conjunctiva/sclera: Conjunctivae normal.  ?Neck:  ?   Thyroid: No thyromegaly.  ?   Vascular: No carotid bruit.  ?Cardiovascular:  ?   Rate and Rhythm: Normal rate and regular rhythm.  ?   Pulses: Normal pulses.  ?   Heart sounds: Normal heart sounds.  ?Pulmonary:  ?  Effort: Pulmonary effort is normal. No respiratory distress.  ?   Breath sounds: No wheezing.  ?Chest:  ?Breasts: ?   Right: No mass, nipple discharge, skin change or tenderness.  ?   Left: No mass, nipple discharge, skin change or tenderness.  ?Abdominal:  ?   General: Bowel sounds are normal.  ?   Palpations: Abdomen is soft.  ?   Tenderness: There is no abdominal tenderness.  ?Genitourinary: ?   Labia:     ?   Right: No tenderness, lesion or injury.     ?   Left: No tenderness, lesion or injury.   ?   Vagina: Normal.  ?   Cervix: Normal.  ?   Uterus: Normal.   ?   Adnexa: Right adnexa normal and left adnexa normal.  ?Musculoskeletal:  ?   Cervical back: Normal range of motion. No erythema.  ?   Right lower leg: No edema.  ?   Left lower leg: No edema.  ?Lymphadenopathy:  ?   Cervical: No cervical adenopathy.  ?Skin: ?   General: Skin is warm and dry.  ?   Findings: No rash.  ?Neurological:  ?   Mental Status: She is alert and oriented to person, place, and time.  ?   Cranial Nerves: No cranial nerve deficit.  ?   Sensory: No sensory deficit.  ?   Deep Tendon Reflexes: Reflexes are normal and symmetric.  ?Psychiatric:     ?   Attention and Perception: Attention normal.     ?   Mood and Affect: Mood normal.  ? ? ?Wt Readings from Last 3 Encounters:  ?05/31/21 168 lb (76.2 kg)  ?09/18/19 162 lb (73.5 kg)  ?04/29/19 155 lb (70.3 kg)  ? ? ?BP  118/82   Pulse 88   Ht '5\' 5"'$  (1.651 m)   Wt 168 lb (76.2 kg)   SpO2 98%   BMI 27.96 kg/m?  ? ?Assessment and Plan: ?1. Annual physical exam ?Normal exam; continue healthy diet and resume more regular ex

## 2021-06-01 LAB — CBC WITH DIFFERENTIAL/PLATELET
Basophils Absolute: 0.1 10*3/uL (ref 0.0–0.2)
Basos: 2 %
EOS (ABSOLUTE): 0.2 10*3/uL (ref 0.0–0.4)
Eos: 3 %
Hematocrit: 37.7 % (ref 34.0–46.6)
Hemoglobin: 12.4 g/dL (ref 11.1–15.9)
Immature Grans (Abs): 0 10*3/uL (ref 0.0–0.1)
Immature Granulocytes: 0 %
Lymphocytes Absolute: 1.9 10*3/uL (ref 0.7–3.1)
Lymphs: 42 %
MCH: 30.9 pg (ref 26.6–33.0)
MCHC: 32.9 g/dL (ref 31.5–35.7)
MCV: 94 fL (ref 79–97)
Monocytes Absolute: 0.5 10*3/uL (ref 0.1–0.9)
Monocytes: 11 %
Neutrophils Absolute: 1.9 10*3/uL (ref 1.4–7.0)
Neutrophils: 42 %
Platelets: 270 10*3/uL (ref 150–450)
RBC: 4.01 x10E6/uL (ref 3.77–5.28)
RDW: 13 % (ref 11.7–15.4)
WBC: 4.6 10*3/uL (ref 3.4–10.8)

## 2021-06-01 LAB — TSH+FREE T4
Free T4: 1.18 ng/dL (ref 0.82–1.77)
TSH: 2.16 u[IU]/mL (ref 0.450–4.500)

## 2021-06-01 LAB — LIPID PANEL
Chol/HDL Ratio: 3.9 ratio (ref 0.0–4.4)
Cholesterol, Total: 221 mg/dL — ABNORMAL HIGH (ref 100–199)
HDL: 57 mg/dL (ref >39)
LDL Chol Calc (NIH): 150 mg/dL — ABNORMAL HIGH (ref 0–99)
Triglycerides: 79 mg/dL (ref 0–149)
VLDL Cholesterol Cal: 14 mg/dL (ref 5–40)

## 2021-06-01 LAB — COMPREHENSIVE METABOLIC PANEL
ALT: 19 IU/L (ref 0–32)
AST: 19 IU/L (ref 0–40)
Albumin/Globulin Ratio: 2.1 (ref 1.2–2.2)
Albumin: 4.7 g/dL (ref 3.8–4.8)
Alkaline Phosphatase: 74 IU/L (ref 44–121)
BUN/Creatinine Ratio: 27 (ref 12–28)
BUN: 27 mg/dL (ref 8–27)
Bilirubin Total: 0.4 mg/dL (ref 0.0–1.2)
CO2: 23 mmol/L (ref 20–29)
Calcium: 9.3 mg/dL (ref 8.7–10.3)
Chloride: 104 mmol/L (ref 96–106)
Creatinine, Ser: 1.01 mg/dL — ABNORMAL HIGH (ref 0.57–1.00)
Globulin, Total: 2.2 g/dL (ref 1.5–4.5)
Glucose: 72 mg/dL (ref 70–99)
Potassium: 4 mmol/L (ref 3.5–5.2)
Sodium: 141 mmol/L (ref 134–144)
Total Protein: 6.9 g/dL (ref 6.0–8.5)
eGFR: 63 mL/min/{1.73_m2} (ref >59)

## 2021-06-01 LAB — CYTOLOGY - PAP
Comment: NEGATIVE
Diagnosis: NEGATIVE
High risk HPV: NEGATIVE

## 2021-06-16 ENCOUNTER — Telehealth: Payer: Self-pay

## 2021-06-16 NOTE — Telephone Encounter (Signed)
Called pt as a reminder to call and schedule mammogram as well as complete and turn in FIT test. Pt verbalized understanding. ? ?KP ?

## 2021-10-03 ENCOUNTER — Other Ambulatory Visit: Payer: Self-pay | Admitting: Internal Medicine

## 2021-10-03 DIAGNOSIS — G43009 Migraine without aura, not intractable, without status migrainosus: Secondary | ICD-10-CM

## 2021-10-04 NOTE — Telephone Encounter (Signed)
Requested Prescriptions  Pending Prescriptions Disp Refills  . rizatriptan (MAXALT-MLT) 10 MG disintegrating tablet [Pharmacy Med Name: Rizatriptan Benzoate '10mg'$  Orally Disintegrating Tablet] 10 tablet 0    Sig: Dissolve 1 tablet by mouth as needed for migraine. May repeat in 2 hours if needed. Do not exceed more than 3 tablets in a 24 hour period.     Neurology:  Migraine Therapy - Triptan Passed - 10/03/2021  3:52 PM      Passed - Last BP in normal range    BP Readings from Last 1 Encounters:  05/31/21 118/82         Passed - Valid encounter within last 12 months    Recent Outpatient Visits          4 months ago Annual physical exam   [facility] Glean Hess, [facility]   1 year ago Migraine without aura and responsive to treatment   [facility] Glean Hess, MD   2 years ago Annual physical exam   [facility] Glean Hess, MD   2 years ago Migraine without aura and responsive to treatment   [facility] Glean Hess, MD   3 years ago Hypothyroidism due to acquired atrophy of thyroid   [facility] Glean Hess, MD

## 2021-11-17 ENCOUNTER — Other Ambulatory Visit: Payer: Self-pay | Admitting: Internal Medicine

## 2021-11-17 DIAGNOSIS — E034 Atrophy of thyroid (acquired): Secondary | ICD-10-CM

## 2021-11-17 NOTE — Telephone Encounter (Signed)
Requested Prescriptions  Pending Prescriptions Disp Refills  . levothyroxine (SYNTHROID) 75 MCG tablet [Pharmacy Med Name: Levothyroxine Sodium 75mg Tablet] 90 tablet 2    Sig: Take 1 tablet by mouth daily.     Endocrinology:  Hypothyroid Agents Passed - 11/17/2021  3:41 PM      Passed - TSH in normal range and within 360 days    TSH  Date Value Ref Range Status  05/31/2021 2.160 0.450 - 4.500 uIU/mL Final         Passed - Valid encounter within last 12 months    Recent Outpatient Visits          5 months ago Annual physical exam   [place] [facility]   1 year ago Migraine without aura and responsive to treatment   [facility]   2 years ago Annual physical exam   [place] [facility]   2 years ago Migraine without aura and responsive to treatment   [facility]   3 years ago Hypothyroidism due to acquired atrophy of thyroid   [facility]

## 2021-12-29 ENCOUNTER — Other Ambulatory Visit: Payer: Self-pay | Admitting: Internal Medicine

## 2021-12-29 DIAGNOSIS — G43009 Migraine without aura, not intractable, without status migrainosus: Secondary | ICD-10-CM

## 2021-12-29 NOTE — Telephone Encounter (Signed)
Requested Prescriptions  Pending Prescriptions Disp Refills   rizatriptan (MAXALT-MLT) 10 MG disintegrating tablet [Pharmacy Med Name: Rizatriptan Benzoate '10mg'$  Orally Disintegrating Tablet] 10 tablet 0    Sig: Dissolve 1 tablet by mouth as needed for migraine. May repeat in 2 hours if needed. Do not exceed more than 3 tablets in a 24 hour period.     Neurology:  Migraine Therapy - Triptan Passed - 12/29/2021 12:34 PM      Passed - Last BP in normal range    BP Readings from Last 1 Encounters:  05/31/21 118/82         Passed - Valid encounter within last 12 months    Recent Outpatient Visits           7 months ago Annual physical exam   [place] [facility]   1 year ago Migraine without aura and responsive to treatment   [facility], Jesse Sans, MD   2 years ago Annual physical exam   [place] [facility] at [facility], Jesse Sans, MD   2 years ago Migraine without aura and responsive to treatment   [facility], Jesse Sans, MD   3 years ago Hypothyroidism due to acquired atrophy of thyroid   [facility] at [facility], Jesse Sans, MD

## 2022-01-22 IMAGING — MG DIGITAL SCREENING BILAT W/ TOMO W/ CAD
8 series · 8 of 24 positions shown · non-contrast
Comparison: Previous exam(s).

CLINICAL DATA: Screening.

EXAM:
DIGITAL SCREENING BILATERAL MAMMOGRAM WITH TOMO AND CAD

[R CC synth-2D]
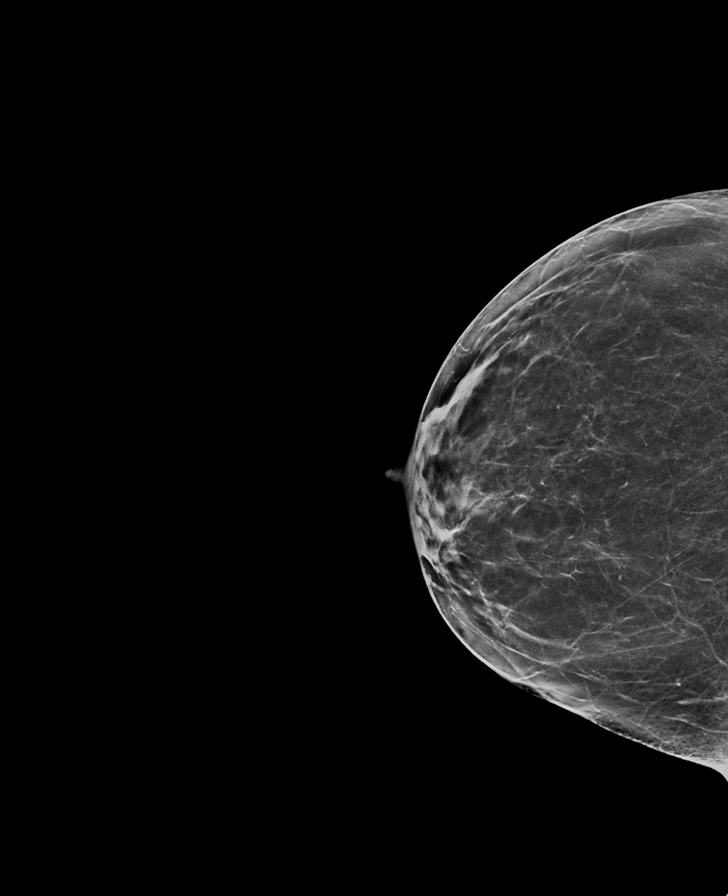

[L MLO synth-2D]
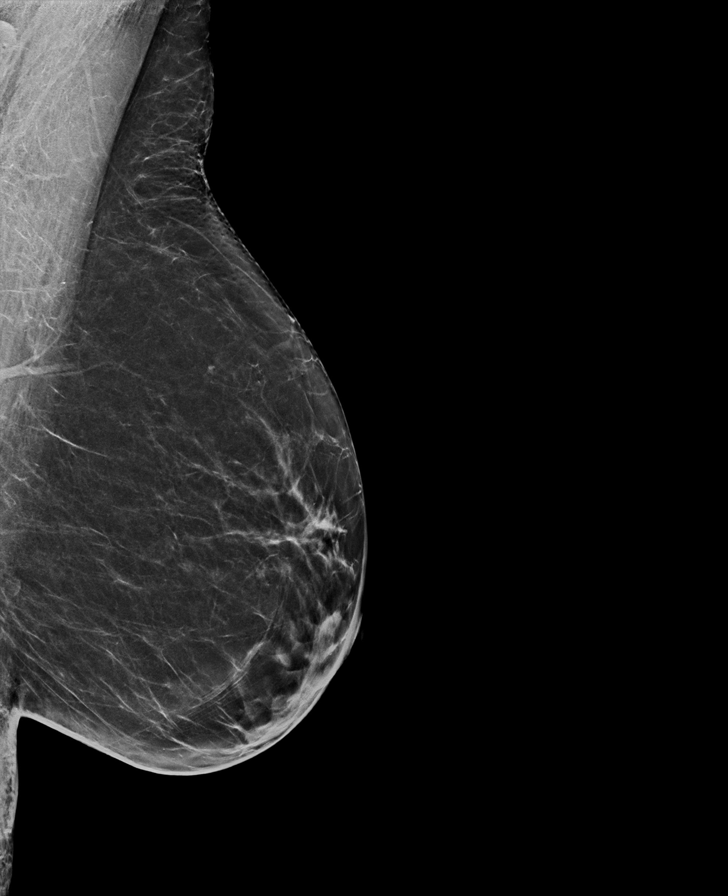

[R MLO synth-2D]
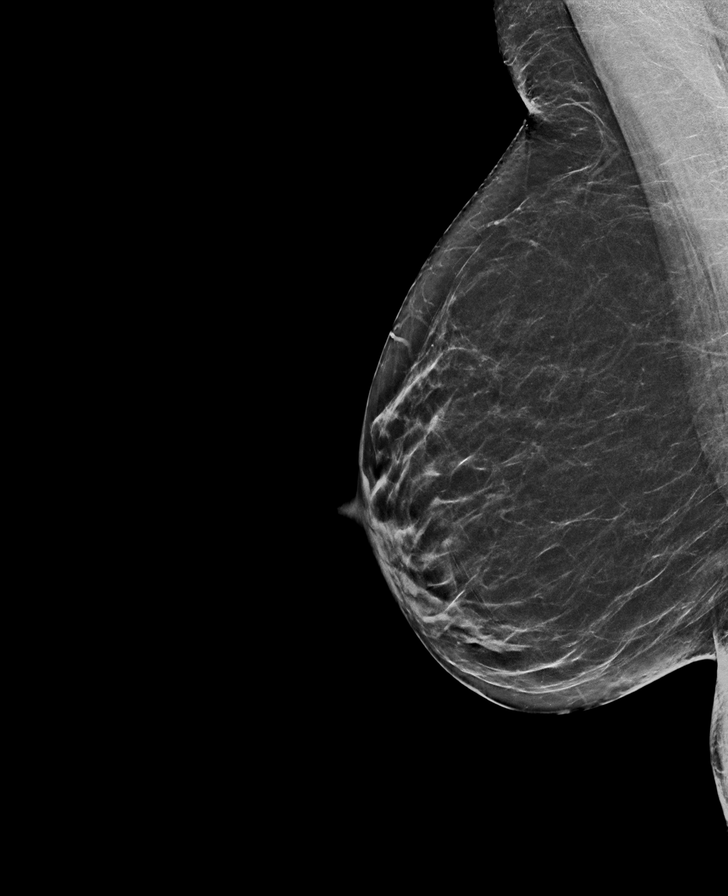

[L CC synth-2D]
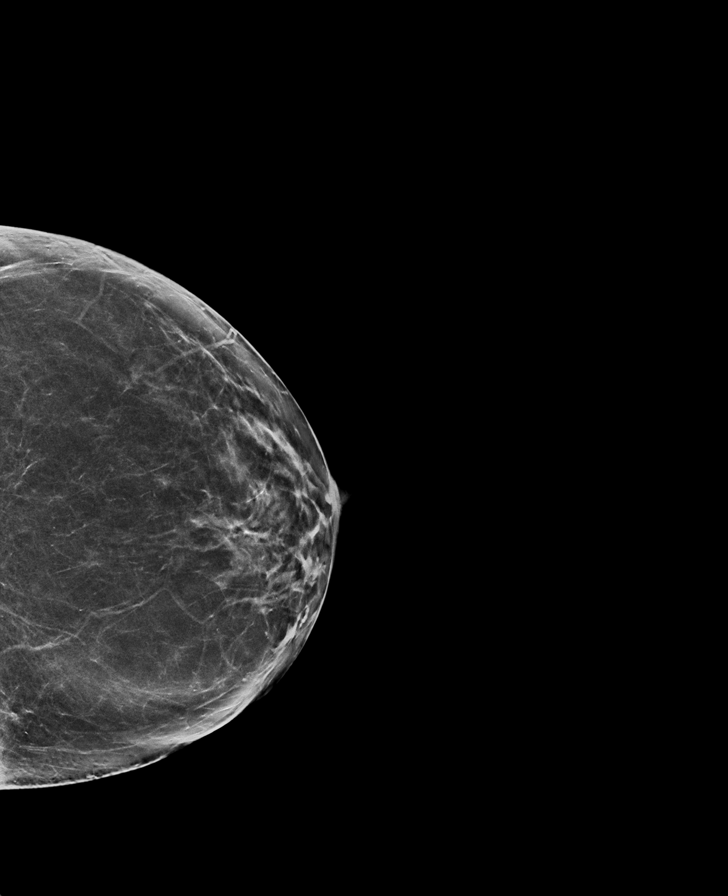

[L MLO tomo · tomo slice 35/68.0]
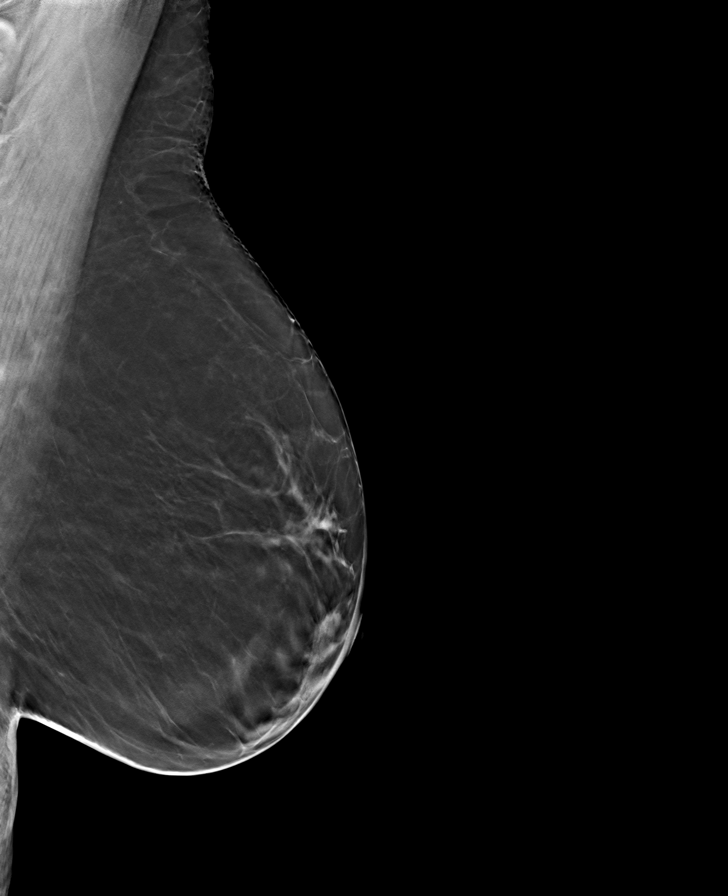

[R MLO tomo · tomo slice 32/63.0]
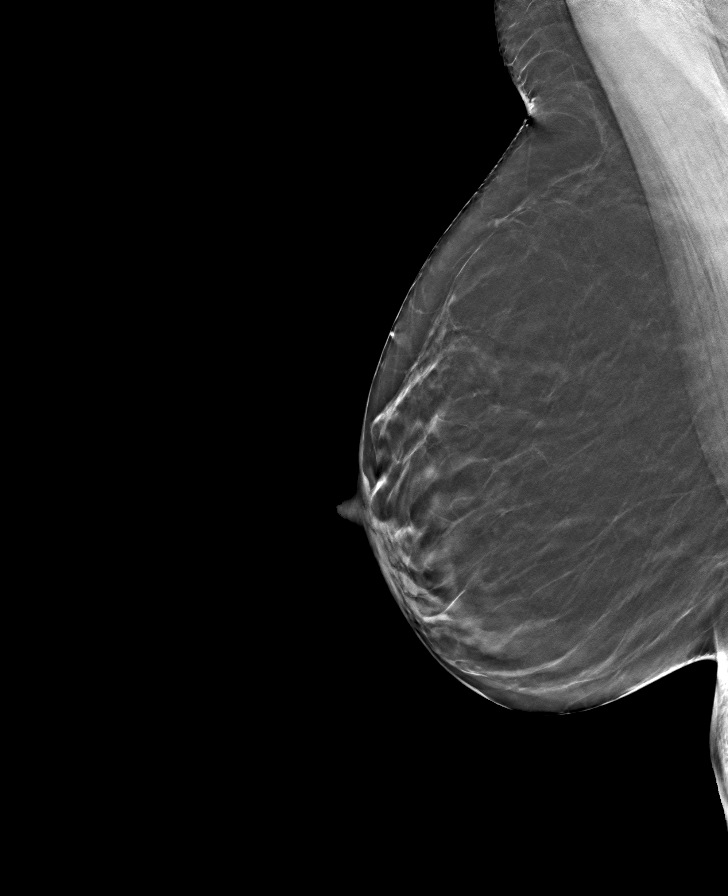

[L CC tomo · tomo slice 35/68.0]
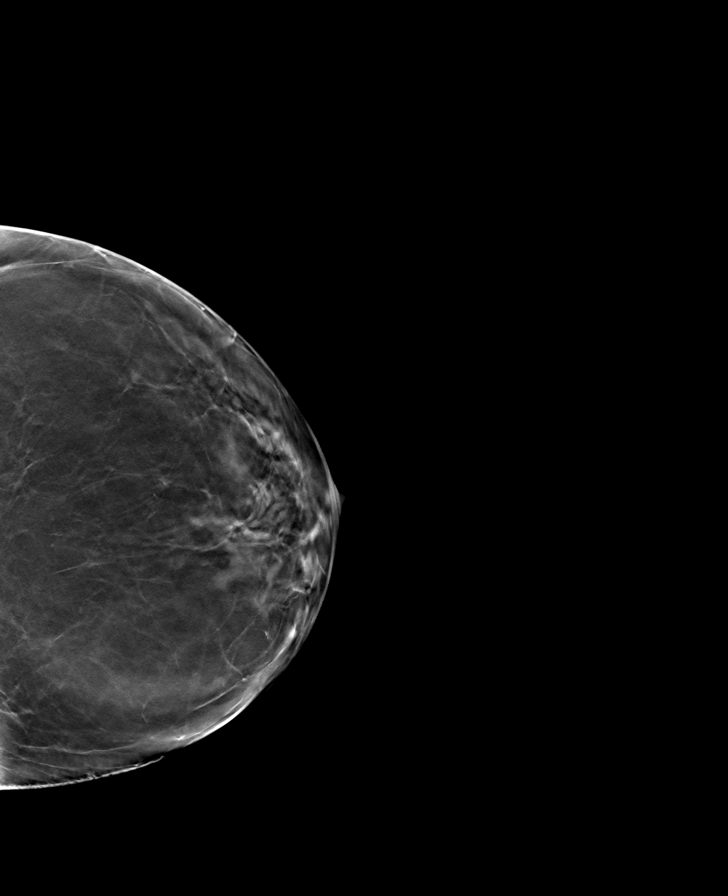

[R CC tomo · tomo slice 33/64.0]
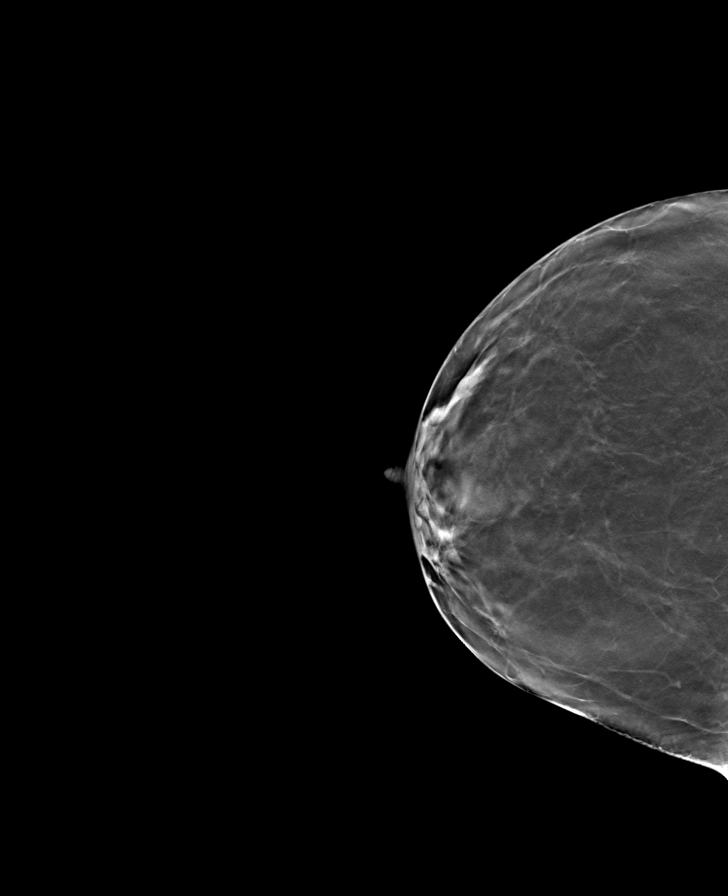

[8 of 24 positions shown; findings below may reference images not displayed]

ACR Breast Density Category b: There are scattered areas of
fibroglandular density.
FINDINGS: There are no findings suspicious for malignancy. Images were
processed with CAD.
IMPRESSION: No mammographic evidence of malignancy. A result letter of this
screening mammogram will be mailed directly to the patient.

RECOMMENDATION:
Screening mammogram in one year. (Code:[id number])

BI-RADS CATEGORY  1: Negative.

## 2022-02-09 DIAGNOSIS — J029 Acute pharyngitis, unspecified: Secondary | ICD-10-CM | POA: Diagnosis not present

## 2022-02-27 ENCOUNTER — Other Ambulatory Visit: Payer: Self-pay | Admitting: Internal Medicine

## 2022-02-27 DIAGNOSIS — G43009 Migraine without aura, not intractable, without status migrainosus: Secondary | ICD-10-CM

## 2022-05-30 ENCOUNTER — Ambulatory Visit (INDEPENDENT_AMBULATORY_CARE_PROVIDER_SITE_OTHER): Payer: BC Managed Care – PPO | Admitting: Internal Medicine

## 2022-05-30 ENCOUNTER — Encounter: Payer: Self-pay | Admitting: Internal Medicine

## 2022-05-30 ENCOUNTER — Other Ambulatory Visit: Payer: Self-pay | Admitting: Internal Medicine

## 2022-05-30 VITALS — BP 124/70 | HR 77 | Ht 65.0 in | Wt 162.8 lb

## 2022-05-30 DIAGNOSIS — R3129 Other microscopic hematuria: Secondary | ICD-10-CM

## 2022-05-30 DIAGNOSIS — E785 Hyperlipidemia, unspecified: Secondary | ICD-10-CM

## 2022-05-30 DIAGNOSIS — Z1231 Encounter for screening mammogram for malignant neoplasm of breast: Secondary | ICD-10-CM | POA: Diagnosis not present

## 2022-05-30 DIAGNOSIS — E034 Atrophy of thyroid (acquired): Secondary | ICD-10-CM | POA: Diagnosis not present

## 2022-05-30 DIAGNOSIS — Z131 Encounter for screening for diabetes mellitus: Secondary | ICD-10-CM

## 2022-05-30 DIAGNOSIS — Z0000 Encounter for general adult medical examination without abnormal findings: Secondary | ICD-10-CM | POA: Diagnosis not present

## 2022-05-30 DIAGNOSIS — Z1211 Encounter for screening for malignant neoplasm of colon: Secondary | ICD-10-CM

## 2022-05-30 DIAGNOSIS — G43009 Migraine without aura, not intractable, without status migrainosus: Secondary | ICD-10-CM

## 2022-05-30 LAB — POCT URINALYSIS DIPSTICK
Bilirubin, UA: NEGATIVE
Glucose, UA: NEGATIVE
Ketones, UA: NEGATIVE
Nitrite, UA: NEGATIVE
Protein, UA: NEGATIVE
Spec Grav, UA: 1.025 (ref 1.010–1.025)
Urobilinogen, UA: 0.2 E.U./dL
pH, UA: 6 (ref 5.0–8.0)

## 2022-05-30 MED ORDER — RIZATRIPTAN BENZOATE 10 MG PO TBDP
ORAL_TABLET | ORAL | 0 refills | Status: DC
Start: 2022-05-30 — End: 2022-06-02

## 2022-05-30 MED ORDER — LEVOTHYROXINE SODIUM 75 MCG PO TABS: 75.0000 ug | ORAL_TABLET | Freq: Every day | ORAL | 3 refills | Status: DC

## 2022-05-30 MED ORDER — TAMSULOSIN HCL 0.4 MG PO CAPS
0.4000 mg | ORAL_CAPSULE | Freq: Every day | ORAL | 0 refills | Status: DC
Start: 2022-05-30 — End: 2022-06-27

## 2022-05-30 NOTE — Patient Instructions (Signed)
Call [facility] to schedule your mammogram at [phone].  

## 2022-05-30 NOTE — Assessment & Plan Note (Signed)
No recent change in the character or frequency of headaches. Headaches respond well to current therapy with Nurtec or Rizatriptan. Will continue current plan and follow up if worsening.

## 2022-05-30 NOTE — Assessment & Plan Note (Signed)
Supplemented. Lab Results  Component Value Date   TSH 2.160 05/31/2021

## 2022-05-30 NOTE — Progress Notes (Signed)
Date:  05/30/2022   Name:  Courtney Davis   DOB:  [birth year]   MRN:  [id number]   Chief Complaint: Annual Exam Courtney Davis is a 62 y.o. female who presents today for her Complete Annual Exam. She feels poorly. She reports exercising. She reports she is sleeping fairly well. Breast complaints - none. She is having right flank pain for 3 days.  Mammogram: 08/2019 - declined DEXA: none Pap smear: 05/2021 neg/neg Colonoscopy: none  Health Maintenance Due  Topic Date Due   DTaP/Tdap/Td (1 - Tdap) Never done   Zoster Vaccines- Shingrix (1 of 2) Never done   COLONOSCOPY (Pts 45-44yrs Insurance coverage will need to be confirmed)  Never done   MAMMOGRAM  09/17/2020   COVID-19 Vaccine (5 - 2023-24 season) 10/21/2021    Immunization History  Administered Date(s) Administered   Influenza,inj,Quad PF,6+ Mos 11/04/2012, 12/09/2013   Influenza-Unspecified 12/21/2016, 11/20/2017, 11/21/2018   PFIZER Comirnaty(Gray Top)Covid-19 Tri-Sucrose Vaccine 05/07/2019, 05/28/2019, 01/26/2020, 12/17/2020    Thyroid Problem Presents for follow-up visit. Patient reports no anxiety, constipation, diarrhea, fatigue, palpitations or tremors. The symptoms have been stable.  Migraine  This is a recurrent problem. The pain quality is similar to prior headaches. Associated symptoms include back pain. Pertinent negatives include no abdominal pain, coughing, dizziness, fever, hearing loss or tinnitus. She has tried triptans (and Nurtec) for the symptoms.  Flank Pain This is a new problem. Episode onset: 3 days ago. The problem has been waxing and waning since onset. The quality of the pain is described as aching, cramping and shooting. Radiates to: RLQ. The pain is moderate. Associated symptoms include headaches and pelvic pain. Pertinent negatives include no abdominal pain, chest pain, dysuria or fever. (Frequency/urgency) She has tried nothing for the symptoms.    Lab Results  Component Value Date    NA 141 05/31/2021   K 4.0 05/31/2021   CO2 23 05/31/2021   GLUCOSE 72 05/31/2021   BUN 27 05/31/2021   CREATININE 1.01 (H) 05/31/2021   CALCIUM 9.3 05/31/2021   EGFR 63 05/31/2021   GFRNONAA 63 09/18/2019   Lab Results  Component Value Date   CHOL 221 (H) 05/31/2021   HDL 57 05/31/2021   LDLCALC 150 (H) 05/31/2021   TRIG 79 05/31/2021   CHOLHDL 3.9 05/31/2021   Lab Results  Component Value Date   TSH 2.160 05/31/2021   No results found for: "HGBA1C" Lab Results  Component Value Date   WBC 4.6 05/31/2021   HGB 12.4 05/31/2021   HCT 37.7 05/31/2021   MCV 94 05/31/2021   PLT 270 05/31/2021   Lab Results  Component Value Date   ALT 19 05/31/2021   AST 19 05/31/2021   ALKPHOS 74 05/31/2021   BILITOT 0.4 05/31/2021   No results found for: "25OHVITD2", "25OHVITD3", "VD25OH"   Review of Systems  Constitutional:  Negative for fatigue and fever.  HENT:  Negative for congestion, hearing loss, tinnitus, trouble swallowing and voice change.   Eyes:  Negative for visual disturbance.  Respiratory:  Negative for cough, chest tightness, shortness of breath and wheezing.   Cardiovascular:  Negative for chest pain, palpitations and leg swelling.  Gastrointestinal:  Negative for abdominal pain, constipation and diarrhea.  Endocrine: Negative for polydipsia and polyuria.  Genitourinary:  Positive for flank pain and pelvic pain. Negative for dysuria, genital sores, vaginal bleeding and vaginal discharge.  Musculoskeletal:  Positive for back pain. Negative for arthralgias, gait problem and joint swelling.  Skin:  Negative  for color change and rash.  Neurological:  Positive for headaches. Negative for dizziness, tremors and light-headedness.  Hematological:  Negative for adenopathy. Does not bruise/bleed easily.  Psychiatric/Behavioral:  Negative for dysphoric mood and sleep disturbance. The patient is not nervous/anxious.     Patient Active Problem List   Diagnosis Date Noted    Mild hyperlipidemia 08/19/2018   Hypothyroidism due to acquired atrophy of thyroid 03/23/2015   Abnormal cervical Papanicolaou smear 07/25/2014   Family history of colonic polyps 07/25/2014   Migraine without aura and responsive to treatment 07/25/2014   Restless leg 07/25/2014    No Known Allergies  Past Surgical History:  Procedure Laterality Date   CERVICAL ABLATION      Social History   Tobacco Use   Smoking status: Never   Smokeless tobacco: Never  Vaping Use   Vaping Use: Never used  Substance Use Topics   Alcohol use: Yes    Alcohol/week: 2.0 standard drinks of alcohol    Types: 2 Standard drinks or equivalent per week    Comment: occasional   Drug use: No     Medication list has been reviewed and updated.  Current Meds  Medication Sig   Multiple Vitamins-Minerals (MULTIVITAMIN WITH MINERALS) tablet Take 1 tablet by mouth daily.   Rimegepant Sulfate (NURTEC) 75 MG TBDP Take 1 tablet by mouth daily as needed.   tamsulosin (FLOMAX) 0.4 MG CAPS capsule Take 1 capsule (0.4 mg total) by mouth daily.   vitamin E 400 UNIT capsule Take 400 Units by mouth daily.   [DISCONTINUED] levothyroxine (SYNTHROID) 75 MCG tablet Take 1 tablet by mouth daily.   [DISCONTINUED] rizatriptan (MAXALT-MLT) 10 MG disintegrating tablet Dissolve 1 tablet by mouth as needed for migraine. May repeat in 2 hours if needed. Do not exceed more than 3 tablets in a 24 hour period.       05/30/2022    9:40 AM 05/31/2021    8:44 AM 06/18/2020   11:18 AM 04/29/2019   11:20 AM  GAD 7 : Generalized Anxiety Score  Nervous, Anxious, on Edge 0 0 0 0  Control/stop worrying 0 0 0 0  Worry too much - different things 0 0 0 0  Trouble relaxing 0 0 0 0  Restless 0 0 0 0  Easily annoyed or irritable 0 1 0 2  Afraid - awful might happen 0 0 0 0  Total GAD 7 Score 0 1 0 2  Anxiety Difficulty Not difficult at all Not difficult at all Not difficult at all Not difficult at all       05/30/2022    9:40 AM  05/31/2021    8:43 AM 06/18/2020   11:18 AM  Depression screen PHQ 2/9  Decreased Interest 0 0 0  Down, Depressed, Hopeless 0 0 0  PHQ - 2 Score 0 0 0  Altered sleeping 0 1 0  Tired, decreased energy 1 1 0  Change in appetite 0 0 0  Feeling bad or failure about yourself  0 0 0  Trouble concentrating 0 0 0  Moving slowly or fidgety/restless 0 0 0  Suicidal thoughts 0 0 0  PHQ-9 Score 1 2 0  Difficult doing work/chores Not difficult at all Not difficult at all Not difficult at all    BP Readings from Last 3 Encounters:  05/30/22 124/70  05/31/21 118/82  09/18/19 126/80    Physical Exam Vitals and nursing note reviewed.  Constitutional:      General: She is not  in acute distress.    Appearance: She is well-developed.  HENT:     Head: Normocephalic and atraumatic.     Right Ear: Tympanic membrane and ear canal normal.     Left Ear: Tympanic membrane and ear canal normal.     Nose:     Right Sinus: No maxillary sinus tenderness.     Left Sinus: No maxillary sinus tenderness.  Eyes:     General: No scleral icterus.       Right eye: No discharge.        Left eye: No discharge.     Conjunctiva/sclera: Conjunctivae normal.  Neck:     Thyroid: No thyromegaly.     Vascular: No carotid bruit.  Cardiovascular:     Rate and Rhythm: Normal rate and regular rhythm.     Pulses: Normal pulses.     Heart sounds: Normal heart sounds.  Pulmonary:     Effort: Pulmonary effort is normal. No respiratory distress.     Breath sounds: No wheezing.  Chest:  Breasts:    Right: No mass, nipple discharge, skin change or tenderness.     Left: No mass, nipple discharge, skin change or tenderness.  Abdominal:     General: Bowel sounds are normal.     Palpations: Abdomen is soft.     Tenderness: There is abdominal tenderness in the right upper quadrant. There is no right CVA tenderness, left CVA tenderness, guarding or rebound.  Musculoskeletal:     Cervical back: Normal range of motion. No  erythema.     Right lower leg: No edema.     Left lower leg: No edema.  Lymphadenopathy:     Cervical: No cervical adenopathy.  Skin:    General: Skin is warm and dry.     Capillary Refill: Capillary refill takes less than 2 seconds.     Findings: No rash.  Neurological:     General: No focal deficit present.     Mental Status: She is alert and oriented to person, place, and time.     Cranial Nerves: No cranial nerve deficit.     Sensory: No sensory deficit.     Deep Tendon Reflexes: Reflexes are normal and symmetric.  Psychiatric:        Attention and Perception: Attention normal.        Mood and Affect: Mood normal.     Wt Readings from Last 3 Encounters:  05/30/22 162 lb 12.8 oz (73.8 kg)  05/31/21 168 lb (76.2 kg)  09/18/19 162 lb (73.5 kg)    BP 124/70   Pulse 77   Ht 5\' 5"  (1.651 m)   Wt 162 lb 12.8 oz (73.8 kg)   SpO2 100%   BMI 27.09 kg/m   Assessment and Plan:  Problem List Items Addressed This Visit       Cardiovascular and Mediastinum   Migraine without aura and responsive to treatment (Chronic)    No recent change in the character or frequency of headaches. Headaches respond well to current therapy with Nurtec or Rizatriptan. Will continue current plan and follow up if worsening.       Relevant Medications   rizatriptan (MAXALT-MLT) 10 MG disintegrating tablet     Endocrine   Hypothyroidism due to acquired atrophy of thyroid (Chronic)    Supplemented. Lab Results  Component Value Date   TSH 2.160 05/31/2021        Relevant Medications   levothyroxine (SYNTHROID) 75 MCG tablet   Other Relevant  Orders   TSH + free T4     Other   Mild hyperlipidemia    Diet controlled.      Relevant Orders   Lipid panel   Other Visit Diagnoses     Annual physical exam    -  Primary   Relevant Orders   CBC with Differential/Platelet   Comprehensive metabolic panel   Lipid panel   TSH + free T4   Encounter for screening mammogram for breast  cancer       Relevant Orders   MM 3D SCREENING MAMMOGRAM BILATERAL BREAST   Colon cancer screening       Relevant Orders   Fecal occult blood, imunochemical   Other microscopic hematuria       UA positive for blood, trace leuks suspect kidney stone - start Flomax for 1-2 weeks push fluids   Relevant Medications   tamsulosin (FLOMAX) 0.4 MG CAPS capsule   Other Relevant Orders   POCT Urinalysis Dipstick   Screening for diabetes mellitus       Relevant Orders   Hemoglobin A1c       No follow-ups on file.   Partially dictated using Dragon software, any errors are not intentional.  Reubin MilanLaura H. Khylah Kendra, MD [facility] [place], [place]

## 2022-05-30 NOTE — Assessment & Plan Note (Signed)
Diet controlled.  

## 2022-06-01 LAB — URINE CULTURE: Organism ID, Bacteria: NO GROWTH

## 2022-06-02 ENCOUNTER — Other Ambulatory Visit: Payer: Self-pay

## 2022-06-02 ENCOUNTER — Ambulatory Visit: Payer: Self-pay

## 2022-06-02 DIAGNOSIS — G43009 Migraine without aura, not intractable, without status migrainosus: Secondary | ICD-10-CM

## 2022-06-02 MED ORDER — RIZATRIPTAN BENZOATE 10 MG PO TBDP
ORAL_TABLET | ORAL | 0 refills | Status: DC
Start: 2022-06-02 — End: 2023-07-17

## 2022-06-02 NOTE — Telephone Encounter (Signed)
  Chief Complaint: Pharmacy need clarification on Rx Symptoms:  Frequency:  Pertinent Negatives: Patient denies  Disposition: [] ED /[] Urgent Care (no appt availability in office) / [] Appointment(In office/virtual)/ []  [facility]/ [] Home Care/ [] Refused Recommended Disposition /[] [facility]/ [x]  Follow-up with PCP Additional Notes: Please return call to pharmacy.    Summary: [employer] requests clarification of the Rx for rizatriptan   Renea Ee with [employer] requests clarification of the Rx for rizatriptan (MAXALT-MLT) 10 MG disintegrating tablet. Cb# [phone]     Reason for Disposition  [1] Caller has URGENT medicine question about med that PCP or specialist prescribed AND [2] triager unable to answer question  Answer Assessment - Initial Assessment Questions 1. REASON FOR CALL or QUESTION: "What is your reason for calling today?" or "How can I best help you?" or "What question do you have that I can help answer?"     Pharmacy needs clarification of Rx 2. CALLER: Document the source of call. (e.g., laboratory, patient).     Pharmacy  Answer Assessment - Initial Assessment Questions 1. NAME of MEDICINE: "What medicine(s) are you calling about?"     Rizatriptan 2. QUESTION: "What is your question?" (e.g., double dose of medicine, side effect)     Pharmacy needs clarification 3. PRESCRIBER: "Who prescribed the medicine?" Reason: if prescribed by specialist, call should be referred to that group.     Dr. Judithann Graves  Protocols used: PCP Call - No Triage-A-AH, Medication Question Call-A-AH

## 2022-06-02 NOTE — Telephone Encounter (Signed)
Updated prescription.  KP

## 2022-06-26 ENCOUNTER — Other Ambulatory Visit: Payer: Self-pay | Admitting: Internal Medicine

## 2022-06-26 DIAGNOSIS — R3129 Other microscopic hematuria: Secondary | ICD-10-CM

## 2022-06-27 NOTE — Telephone Encounter (Signed)
Requested Prescriptions  Pending Prescriptions Disp Refills   tamsulosin (FLOMAX) 0.4 MG CAPS capsule [Pharmacy Med Name: TAMSULOSIN 0.4MG  CAPSULES] 90 capsule 3    Sig: TAKE 1 CAPSULE(0.4 MG) BY MOUTH DAILY     Urology: Alpha-Adrenergic Blocker Failed - 06/26/2022  3:27 AM      Failed - PSA in normal range and within 360 days    No results found for: "LABPSA", "PSA", "PSA1", "ULTRAPSA"       Passed - Last BP in normal range    BP Readings from Last 1 Encounters:  05/30/22 124/70         Passed - Valid encounter within last 12 months    Recent Outpatient Visits           4 weeks ago Annual physical exam   [facility] at [facility], Nyoka Cowden, MD   1 year ago Annual physical exam   [facility] at [facility], Nyoka Cowden, MD   2 years ago Migraine without aura and responsive to treatment   [facility] at [facility], Nyoka Cowden, MD   2 years ago Annual physical exam   [facility] at [facility], Nyoka Cowden, MD   3 years ago Migraine without aura and responsive to treatment   [facility] Primary [facility] & [facility] Medicine at [facility], Nyoka Cowden, MD       Future Appointments             In 11 months Judithann Graves, Nyoka Cowden, MD [facility] Medicine at [facility], [facility]

## 2023-05-10 ENCOUNTER — Other Ambulatory Visit: Payer: Self-pay | Admitting: Internal Medicine

## 2023-05-10 DIAGNOSIS — E034 Atrophy of thyroid (acquired): Secondary | ICD-10-CM

## 2023-05-11 NOTE — Telephone Encounter (Signed)
 Requested medication (s) are due for refill today: Yes  Requested medication (s) are on the active medication list: Yes  Last refill:  4//9/24 #90, 4 refills  Future visit scheduled: Yes  Notes to clinic:  Unable to refill per protocol due to failed labs, no updated results.      Requested Prescriptions  Pending Prescriptions Disp Refills   levothyroxine (SYNTHROID) 75 MCG tablet [Pharmacy Med Name: Levothyroxine Sodium Tablet] 90 tablet 2    Sig: Take 1 tablet by mouth daily.     Endocrinology:  Hypothyroid Agents Failed - 05/11/2023 12:39 PM      Failed - TSH in normal range and within 360 days    TSH  Date Value Ref Range Status  05/31/2021 2.160 0.450 - 4.500 uIU/mL Final         Passed - Valid encounter within last 12 months    Recent Outpatient Visits           11 months ago Annual physical exam   [facility] at [facility], Nyoka Cowden, MD   1 year ago Annual physical exam   [facility] at [facility], Nyoka Cowden, MD   2 years ago Migraine without aura and responsive to treatment   [facility] Medicine at [facility], Nyoka Cowden, MD   3 years ago Annual physical exam   [facility] at [facility], Nyoka Cowden, MD   4 years ago Migraine without aura and responsive to treatment   [facility] & [facility] Medicine at [facility], Nyoka Cowden, MD       Future Appointments             In 3 weeks Judithann Graves Nyoka Cowden, MD [facility] Primary Care & Sports Medicine at [facility], [facility]

## 2023-06-01 ENCOUNTER — Ambulatory Visit (INDEPENDENT_AMBULATORY_CARE_PROVIDER_SITE_OTHER): Payer: Self-pay | Admitting: Internal Medicine

## 2023-06-01 ENCOUNTER — Encounter: Payer: Self-pay | Admitting: Internal Medicine

## 2023-06-01 VITALS — BP 118/72 | HR 85 | Ht 65.0 in | Wt 174.4 lb

## 2023-06-01 DIAGNOSIS — Z0000 Encounter for general adult medical examination without abnormal findings: Secondary | ICD-10-CM

## 2023-06-01 DIAGNOSIS — Z1211 Encounter for screening for malignant neoplasm of colon: Secondary | ICD-10-CM | POA: Diagnosis not present

## 2023-06-01 DIAGNOSIS — G43009 Migraine without aura, not intractable, without status migrainosus: Secondary | ICD-10-CM

## 2023-06-01 DIAGNOSIS — E034 Atrophy of thyroid (acquired): Secondary | ICD-10-CM | POA: Diagnosis not present

## 2023-06-01 DIAGNOSIS — Z1231 Encounter for screening mammogram for malignant neoplasm of breast: Secondary | ICD-10-CM

## 2023-06-01 DIAGNOSIS — E785 Hyperlipidemia, unspecified: Secondary | ICD-10-CM | POA: Diagnosis not present

## 2023-06-01 DIAGNOSIS — J329 Chronic sinusitis, unspecified: Secondary | ICD-10-CM

## 2023-06-01 NOTE — Progress Notes (Signed)
 Date:  06/01/2023   Name:  Courtney Davis   DOB:  Jun 26, 1960   MRN:  [id number]   Chief Complaint: Annual Exam Courtney Davis is a 63 y.o. female who presents today for her Complete Annual Exam. She feels well. She reports exercising none. She reports she is sleeping fairly well. Breast complaints none.  Health Maintenance  Topic Date Due   HIV Screening  Never done   DTaP/Tdap/Td vaccine (1 - Tdap) Never done   Colon Cancer Screening  Never done   Zoster (Shingles) Vaccine (1 of 2) Never done   Mammogram  09/17/2020   COVID-19 Vaccine (5 - 2024-25 season) 06/17/2023*   Flu Shot  09/21/2023   Pap with HPV screening  06/01/2026   Hepatitis C Screening  Completed   HPV Vaccine  Aged Out   Meningitis B Vaccine  Aged Out  *Topic was postponed. The date shown is not the original due date.    Thyroid Problem Presents for follow-up visit. Patient reports no anxiety, constipation, diarrhea, fatigue or palpitations. The symptoms have been stable.  Migraine  This is a recurrent problem. The problem has been unchanged. Associated symptoms include sinus pressure. Pertinent negatives include no abdominal pain, coughing, dizziness or weakness. Treatments tried: CGRBs.    Review of Systems  Constitutional:  Negative for fatigue and unexpected weight change.  HENT:  Positive for congestion, postnasal drip and sinus pressure. Negative for trouble swallowing.   Eyes:  Negative for visual disturbance.  Respiratory:  Negative for cough, chest tightness, shortness of breath and wheezing.   Cardiovascular:  Negative for chest pain, palpitations and leg swelling.  Gastrointestinal:  Negative for abdominal pain, constipation and diarrhea.  Genitourinary:  Negative for dysuria and hematuria.  Musculoskeletal:  Negative for arthralgias and myalgias.  Neurological:  Positive for headaches (occasional). Negative for dizziness, weakness and light-headedness.  Psychiatric/Behavioral:   Negative for decreased concentration and sleep disturbance. The patient is not nervous/anxious.      Lab Results  Component Value Date   NA 141 05/31/2021   K 4.0 05/31/2021   CO2 23 05/31/2021   GLUCOSE 72 05/31/2021   BUN 27 05/31/2021   CREATININE 1.01 (H) 05/31/2021   CALCIUM 9.3 05/31/2021   EGFR 63 05/31/2021   GFRNONAA 63 09/18/2019   Lab Results  Component Value Date   CHOL 221 (H) 05/31/2021   HDL 57 05/31/2021   LDLCALC 150 (H) 05/31/2021   TRIG 79 05/31/2021   CHOLHDL 3.9 05/31/2021   Lab Results  Component Value Date   TSH 2.160 05/31/2021   No results found for: "HGBA1C" Lab Results  Component Value Date   WBC 4.6 05/31/2021   HGB 12.4 05/31/2021   HCT 37.7 05/31/2021   MCV 94 05/31/2021   PLT 270 05/31/2021   Lab Results  Component Value Date   ALT 19 05/31/2021   AST 19 05/31/2021   ALKPHOS 74 05/31/2021   BILITOT 0.4 05/31/2021   No results found for: "25OHVITD2", "25OHVITD3", "VD25OH"   Patient Active Problem List   Diagnosis Date Noted   Mild hyperlipidemia 08/19/2018   Hypothyroidism due to acquired atrophy of thyroid 03/23/2015   Abnormal cervical Papanicolaou smear 07/25/2014   Family history of colonic polyps 07/25/2014   Migraine without aura and responsive to treatment 07/25/2014   Restless leg 07/25/2014    No Known Allergies  Past Surgical History:  Procedure Laterality Date   CERVICAL ABLATION      Social History  Tobacco Use   Smoking status: Never   Smokeless tobacco: Never  Vaping Use   Vaping status: Never Used  Substance Use Topics   Alcohol use: Yes    Alcohol/week: 2.0 standard drinks of alcohol    Types: 2 Standard drinks or equivalent per week    Comment: occasional   Drug use: No     Medication list has been reviewed and updated.  Current Meds  Medication Sig   levothyroxine (SYNTHROID) 75 MCG tablet Take 1 tablet by mouth daily.   Multiple Vitamins-Minerals (MULTIVITAMIN WITH MINERALS) tablet  Take 1 tablet by mouth daily.   Rimegepant Sulfate (NURTEC) 75 MG TBDP Take 1 tablet by mouth daily as needed.   rizatriptan (MAXALT-MLT) 10 MG disintegrating tablet Dissolve 1 tablet by mouth as needed for migraine. May repeat in 2 hours if needed. Do not exceed more than 3 tablets in a 24 hour period.   vitamin E 400 UNIT capsule Take 400 Units by mouth daily.   [DISCONTINUED] tamsulosin (FLOMAX) 0.4 MG CAPS capsule TAKE 1 CAPSULE(0.4 MG) BY MOUTH DAILY       06/01/2023    9:23 AM 05/30/2022    9:40 AM 05/31/2021    8:44 AM 06/18/2020   11:18 AM  GAD 7 : Generalized Anxiety Score  Nervous, Anxious, on Edge 1 0 0 0  Control/stop worrying 1 0 0 0  Worry too much - different things 1 0 0 0  Trouble relaxing 1 0 0 0  Restless 1 0 0 0  Easily annoyed or irritable 1 0 1 0  Afraid - awful might happen 0 0 0 0  Total GAD 7 Score 6 0 1 0  Anxiety Difficulty  Not difficult at all Not difficult at all Not difficult at all       06/01/2023    9:23 AM 05/30/2022    9:40 AM 05/31/2021    8:43 AM  Depression screen PHQ 2/9  Decreased Interest 0 0 0  Down, Depressed, Hopeless 0 0 0  PHQ - 2 Score 0 0 0  Altered sleeping 0 0 1  Tired, decreased energy 1 1 1   Change in appetite 0 0 0  Feeling bad or failure about yourself  0 0 0  Trouble concentrating 1 0 0  Moving slowly or fidgety/restless 0 0 0  Suicidal thoughts 0 0 0  PHQ-9 Score 2 1 2   Difficult doing work/chores  Not difficult at all Not difficult at all    BP Readings from Last 3 Encounters:  06/01/23 118/72  05/30/22 124/70  05/31/21 118/82    Physical Exam Vitals and nursing note reviewed.  Constitutional:      General: She is not in acute distress.    Appearance: She is well-developed.  HENT:     Head: Normocephalic and atraumatic.     Right Ear: Tympanic membrane and ear canal normal.     Left Ear: Tympanic membrane and ear canal normal.     Nose:     Right Sinus: No maxillary sinus tenderness.     Left Sinus: No  maxillary sinus tenderness.  Eyes:     General: No scleral icterus.       Right eye: No discharge.        Left eye: No discharge.     Conjunctiva/sclera: Conjunctivae normal.  Neck:     Thyroid: No thyromegaly.     Vascular: No carotid bruit.  Cardiovascular:     Rate and Rhythm: Normal  rate and regular rhythm.     Pulses: Normal pulses.     Heart sounds: Normal heart sounds.  Pulmonary:     Effort: Pulmonary effort is normal. No respiratory distress.     Breath sounds: No wheezing.  Chest:  Breasts:    Right: No mass, nipple discharge, skin change or tenderness.     Left: No mass, nipple discharge, skin change or tenderness.  Abdominal:     General: Bowel sounds are normal.     Palpations: Abdomen is soft.     Tenderness: There is no abdominal tenderness.  Musculoskeletal:     Cervical back: Normal range of motion. No erythema.     Right lower leg: No edema.     Left lower leg: No edema.  Lymphadenopathy:     Cervical: No cervical adenopathy.  Skin:    General: Skin is warm and dry.     Findings: No rash.  Neurological:     Mental Status: She is alert and oriented to person, place, and time.     Cranial Nerves: No cranial nerve deficit.     Sensory: No sensory deficit.     Deep Tendon Reflexes: Reflexes are normal and symmetric.  Psychiatric:        Attention and Perception: Attention normal.        Mood and Affect: Mood normal.     Wt Readings from Last 3 Encounters:  06/01/23 174 lb 6 oz (79.1 kg)  05/30/22 162 lb 12.8 oz (73.8 kg)  05/31/21 168 lb (76.2 kg)    BP 118/72   Pulse 85   Ht 5\' 5"  (1.651 m)   Wt 174 lb 6 oz (79.1 kg)   SpO2 95%   BMI 29.02 kg/m   Assessment and Plan:  Problem List Items Addressed This Visit       Unprioritized   Migraine without aura and responsive to treatment (Chronic)   No recent change in migraine headaches. Headaches respond well to current therapy with Nurtec or Maxalt. Will continue regimen;  follow up if  worsening.       Hypothyroidism due to acquired atrophy of thyroid (Chronic)   Supplemented - will get labs and adjust dose if needed.      Relevant Orders   TSH + free T4   Mild hyperlipidemia   Managed with diet only. Continue low fat diet choices and begin regular exercise.      Relevant Orders   Lipid panel   Other Visit Diagnoses       Annual physical exam    -  Primary   Relevant Orders   CBC with Differential/Platelet   Comprehensive metabolic panel with GFR   Lipid panel   TSH + free T4     Encounter for screening mammogram for breast cancer       Relevant Orders   MM 3D SCREENING MAMMOGRAM BILATERAL BREAST     Colon cancer screening       Relevant Orders   Fecal occult blood, imunochemical     Chronic congestion of paranasal sinus       continue flonase daily for symptoms req ENT evaluation   Relevant Orders   Ambulatory referral to ENT       Return in about 1 year (around 05/31/2024) for CPX.    Reubin Milan, MD [facility] Medicine [place]

## 2023-06-01 NOTE — Assessment & Plan Note (Signed)
 Managed with diet only. Continue low fat diet choices and begin regular exercise.

## 2023-06-01 NOTE — Patient Instructions (Signed)
 Call [facility] to schedule your mammogram at [phone].

## 2023-06-01 NOTE — Assessment & Plan Note (Signed)
 No recent change in migraine headaches. Headaches respond well to current therapy with Nurtec or Maxalt. Will continue regimen;  follow up if worsening.

## 2023-06-01 NOTE — Assessment & Plan Note (Signed)
 Supplemented - will get labs and adjust dose if needed.

## 2023-06-02 LAB — TSH+FREE T4
Free T4: 1.05 ng/dL (ref 0.82–1.77)
TSH: 1.88 u[IU]/mL (ref 0.450–4.500)

## 2023-06-02 LAB — LIPID PANEL
Chol/HDL Ratio: 5.1 ratio — ABNORMAL HIGH (ref 0.0–4.4)
Cholesterol, Total: 247 mg/dL — ABNORMAL HIGH (ref 100–199)
HDL: 48 mg/dL (ref >39)
LDL Chol Calc (NIH): 178 mg/dL — ABNORMAL HIGH (ref 0–99)
Triglycerides: 119 mg/dL (ref 0–149)
VLDL Cholesterol Cal: 21 mg/dL (ref 5–40)

## 2023-06-02 LAB — COMPREHENSIVE METABOLIC PANEL WITH GFR
ALT: 21 IU/L (ref 0–32)
AST: 19 IU/L (ref 0–40)
Albumin: 4.5 g/dL (ref 3.9–4.9)
Alkaline Phosphatase: 70 IU/L (ref 44–121)
BUN/Creatinine Ratio: 22 (ref 12–28)
BUN: 22 mg/dL (ref 8–27)
Bilirubin Total: 0.3 mg/dL (ref 0.0–1.2)
CO2: 20 mmol/L (ref 20–29)
Calcium: 9.3 mg/dL (ref 8.7–10.3)
Chloride: 106 mmol/L (ref 96–106)
Creatinine, Ser: 1.02 mg/dL — ABNORMAL HIGH (ref 0.57–1.00)
Globulin, Total: 2.8 g/dL (ref 1.5–4.5)
Glucose: 86 mg/dL (ref 70–99)
Potassium: 4.3 mmol/L (ref 3.5–5.2)
Sodium: 143 mmol/L (ref 134–144)
Total Protein: 7.3 g/dL (ref 6.0–8.5)
eGFR: 62 mL/min/{1.73_m2} (ref >59)

## 2023-06-02 LAB — CBC WITH DIFFERENTIAL/PLATELET
Basophils Absolute: 0.1 10*3/uL (ref 0.0–0.2)
Basos: 2 %
EOS (ABSOLUTE): 0.1 10*3/uL (ref 0.0–0.4)
Eos: 2 %
Hematocrit: 39.8 % (ref 34.0–46.6)
Hemoglobin: 12.9 g/dL (ref 11.1–15.9)
Immature Grans (Abs): 0 10*3/uL (ref 0.0–0.1)
Immature Granulocytes: 0 %
Lymphocytes Absolute: 1.7 10*3/uL (ref 0.7–3.1)
Lymphs: 43 %
MCH: 30 pg (ref 26.6–33.0)
MCHC: 32.4 g/dL (ref 31.5–35.7)
MCV: 93 fL (ref 79–97)
Monocytes Absolute: 0.4 10*3/uL (ref 0.1–0.9)
Monocytes: 11 %
Neutrophils Absolute: 1.7 10*3/uL (ref 1.4–7.0)
Neutrophils: 42 %
Platelets: 274 10*3/uL (ref 150–450)
RBC: 4.3 x10E6/uL (ref 3.77–5.28)
RDW: 12.4 % (ref 11.7–15.4)
WBC: 4 10*3/uL (ref 3.4–10.8)

## 2023-06-03 ENCOUNTER — Encounter: Payer: Self-pay | Admitting: Internal Medicine

## 2023-06-11 DIAGNOSIS — Z1211 Encounter for screening for malignant neoplasm of colon: Secondary | ICD-10-CM | POA: Diagnosis not present

## 2023-06-15 LAB — FECAL OCCULT BLOOD, IMMUNOCHEMICAL: Fecal Occult Bld: NEGATIVE

## 2023-06-19 ENCOUNTER — Other Ambulatory Visit: Payer: Self-pay | Admitting: Internal Medicine

## 2023-06-19 DIAGNOSIS — E034 Atrophy of thyroid (acquired): Secondary | ICD-10-CM

## 2023-06-22 NOTE — Telephone Encounter (Signed)
 Requested Prescriptions  Pending Prescriptions Disp Refills   levothyroxine  (SYNTHROID ) 75 MCG tablet [Pharmacy Med Name: Levothyroxine  Sodium 75mcg Tablet] 90 tablet 3    Sig: Take 1 tablet by mouth daily.     Endocrinology:  Hypothyroid Agents Passed - 06/22/2023 10:01 AM      Passed - TSH in normal range and within 360 days    TSH  Date Value Ref Range Status  06/01/2023 1.880 0.450 - 4.500 uIU/mL Final         Passed - Valid encounter within last 12 months    Recent Outpatient Visits           3 weeks ago Annual physical exam   [facility] at [facility], [place], MD       Future Appointments             In 11 months Barnetta Liberty, MD [facility] Medicine at [facility], [facility]

## 2023-07-17 ENCOUNTER — Telehealth: Admitting: Family Medicine

## 2023-07-17 ENCOUNTER — Other Ambulatory Visit: Payer: Self-pay

## 2023-07-17 DIAGNOSIS — R3989 Other symptoms and signs involving the genitourinary system: Secondary | ICD-10-CM | POA: Diagnosis not present

## 2023-07-17 DIAGNOSIS — G43009 Migraine without aura, not intractable, without status migrainosus: Secondary | ICD-10-CM

## 2023-07-17 MED ORDER — CEPHALEXIN 500 MG PO CAPS: 500.0000 mg | ORAL_CAPSULE | Freq: Two times a day (BID) | ORAL | 0 refills | Status: AC

## 2023-07-17 MED ORDER — RIZATRIPTAN BENZOATE 10 MG PO TBDP: ORAL_TABLET | ORAL | 0 refills | Status: DC

## 2023-07-17 NOTE — Progress Notes (Signed)
 I have spent 5 minutes in review of e-visit questionnaire, review and updating patient chart, medical decision making and response to patient.   Piedad Climes, PA-C

## 2023-07-17 NOTE — Progress Notes (Signed)

## 2023-07-24 ENCOUNTER — Ambulatory Visit: Payer: Self-pay

## 2023-07-24 ENCOUNTER — Telehealth: Payer: Self-pay | Admitting: Pharmacy Technician

## 2023-07-24 ENCOUNTER — Other Ambulatory Visit: Payer: Self-pay | Admitting: Student

## 2023-07-24 ENCOUNTER — Encounter: Payer: Self-pay | Admitting: Student

## 2023-07-24 ENCOUNTER — Ambulatory Visit

## 2023-07-24 ENCOUNTER — Ambulatory Visit (INDEPENDENT_AMBULATORY_CARE_PROVIDER_SITE_OTHER): Admitting: Student

## 2023-07-24 ENCOUNTER — Other Ambulatory Visit (HOSPITAL_COMMUNITY): Payer: Self-pay

## 2023-07-24 VITALS — BP 118/82 | HR 73 | Ht 65.0 in | Wt 175.4 lb

## 2023-07-24 DIAGNOSIS — R319 Hematuria, unspecified: Secondary | ICD-10-CM | POA: Insufficient documentation

## 2023-07-24 DIAGNOSIS — R399 Unspecified symptoms and signs involving the genitourinary system: Secondary | ICD-10-CM | POA: Diagnosis not present

## 2023-07-24 LAB — POCT URINALYSIS DIPSTICK
Bilirubin, UA: NEGATIVE
Glucose, UA: NEGATIVE
Ketones, UA: NEGATIVE
Nitrite, UA: NEGATIVE
Protein, UA: POSITIVE — AB
Spec Grav, UA: 1.02 (ref 1.010–1.025)
Urobilinogen, UA: 0.2 U/dL
pH, UA: 6.5 (ref 5.0–8.0)

## 2023-07-24 NOTE — Progress Notes (Signed)
 BMP  Established Patient Office Visit  Subjective   Patient ID: Courtney Davis, female    DOB: 02/01/1961  Age: 63 y.o. MRN: [id number]  Chief Complaint  Patient presents with   Urinary Tract Infection    Burning when urinating, pressure to go frequently, started keflex  1 week ago, did not complete full course due to a reaction, face swelling, rash, body aches   Dysuria Back pain, with urinary frequency, clots in the blood, and dysuria for 1 week. Had e visit and prescribed keflex  on 5/27.Took keflex  for 4 days and then discontinued due to symptoms of facial swelling, rash, HA, and body aches. Hs never taken keflex  in the past. Does not recall other antibiotic use in the past. These symptoms have resolved but still having urinary symptoms. Symptoms feel similar to episode of suspected kidney stones in April 2024 and was treated with flomax . She feels symptoms resolved at that time but is unsure if she ever passed a stone.  She denies fever and chills, reports some nausea but no vomiting.   Patient Active Problem List   Diagnosis Date Noted   Hematuria 07/24/2023   Mild hyperlipidemia 08/19/2018   Hypothyroidism due to acquired atrophy of thyroid  03/23/2015   Abnormal cervical Papanicolaou smear 07/25/2014   Family history of colonic polyps 07/25/2014   Migraine without aura and responsive to treatment 07/25/2014   Restless leg 07/25/2014      Review of Systems  Gastrointestinal:  Positive for nausea. Negative for vomiting.  Genitourinary:  Positive for frequency.   Refer to HPI    Objective:     BP 118/82   Pulse 73   Ht 5\' 5"  (1.651 m)   Wt 175 lb 6.4 oz (79.6 kg)   SpO2 98%   BMI 29.19 kg/m  BP Readings from Last 3 Encounters:  07/24/23 118/82  06/01/23 118/72  05/30/22 124/70    Physical Exam Constitutional:      Appearance: Normal appearance.  HENT:     Mouth/Throat:     Mouth: Mucous membranes are moist.     Pharynx: Oropharynx is clear.      Comments: No swelling oropharynx is clear Cardiovascular:     Rate and Rhythm: Normal rate and regular rhythm.  Pulmonary:     Effort: Pulmonary effort is normal.     Breath sounds: No rhonchi or rales.  Abdominal:     General: Abdomen is flat. Bowel sounds are normal. There is no distension.     Palpations: Abdomen is soft.     Tenderness: There is no abdominal tenderness. There is no right CVA tenderness or left CVA tenderness.  Musculoskeletal:        General: Normal range of motion.     Right lower leg: No edema.     Left lower leg: No edema.  Skin:    General: Skin is warm and dry.     Capillary Refill: Capillary refill takes less than 2 seconds.     Comments: No hives or rash  Neurological:     General: No focal deficit present.     Mental Status: She is alert and oriented to person, place, and time.  Psychiatric:        Mood and Affect: Mood normal.        Behavior: Behavior normal.        07/24/2023    1:19 PM 06/01/2023    9:23 AM 05/30/2022    9:40 AM  Depression screen PHQ 2/9  Decreased Interest 0 0 0  Down, Depressed, Hopeless 0 0 0  PHQ - 2 Score 0 0 0  Altered sleeping 1 0 0  Tired, decreased energy 2 1 1   Change in appetite 2 0 0  Feeling bad or failure about yourself  0 0 0  Trouble concentrating 1 1 0  Moving slowly or fidgety/restless 1 0 0  Suicidal thoughts 0 0 0  PHQ-9 Score 7 2 1   Difficult doing work/chores Somewhat difficult  Not difficult at all       07/24/2023    1:20 PM 06/01/2023    9:23 AM 05/30/2022    9:40 AM 05/31/2021    8:44 AM  GAD 7 : Generalized Anxiety Score  Nervous, Anxious, on Edge 0 1 0 0  Control/stop worrying 0 1 0 0  Worry too much - different things 0 1 0 0  Trouble relaxing 1 1 0 0  Restless 1 1 0 0  Easily annoyed or irritable 0 1 0 1  Afraid - awful might happen 0 0 0 0  Total GAD 7 Score 2 6 0 1  Anxiety Difficulty Somewhat difficult  Not difficult at all Not difficult at all    Results for orders placed or  performed in visit on 07/24/23  POCT Urinalysis Dipstick  Result Value Ref Range   Color, UA Yellow    Clarity, UA Cloudy    Glucose, UA Negative Negative   Bilirubin, UA Negative    Ketones, UA Negative    Spec Grav, UA 1.020 1.010 - 1.025   Blood, UA Large    pH, UA 6.5 5.0 - 8.0   Protein, UA Positive (A) Negative   Urobilinogen, UA 0.2 0.2 or 1.0 E.U./dL   Nitrite, UA Negative    Leukocytes, UA Large (3+) (A) Negative   Appearance     Odor Foul       The 10-year ASCVD risk score (Arnett DK, et al., 2019) is: 4.7%    Assessment & Plan:  Hematuria, unspecified type Assessment & Plan: Small amount of blood clots on urine sample in office today. History of microscopic hematuria suspected to be due to nephrolithiasis. No having urinary symptoms. Tried keflex  for 4 days last week but had rash and swelling in lips that has since resolved. Keflex  added to allergy list. Urine dipstick positive for blood, leukocytes, and protein. Non toxic appearing on exam. Possible nephrolithiasis vs UTI.  -UA w/r reflex micropsic, urine culture -CT renal stone study  -NSAIDs for pain -Encourage PO hydration -ED precautions for uncontrollable pain or fever  Orders: -     Urine Culture -     POCT urinalysis dipstick -     Urinalysis, Routine w reflex microscopic -     Basic metabolic panel with GFR -     CT RENAL STONE STUDY; Future     No follow-ups on file.    Barnetta Liberty, MD

## 2023-07-24 NOTE — Telephone Encounter (Signed)
 Pharmacy Patient Advocate Encounter   Received notification from Onbase that prior authorization for Rizatriptan  Benzoate 10MG  dispersible tablets is required/requested.   Insurance verification completed.   The patient is insured through [employer] .   Per test claim: The current 30 day co-pay is, $12.16.  No PA needed at this time. This test claim was processed through [facility]- copay amounts may vary at other pharmacies due to pharmacy/plan contracts, or as the patient moves through the different stages of their insurance plan.       Per insurance plan #9 tabs is 30 day supply (Maximum daily dose of .3000)

## 2023-07-24 NOTE — Telephone Encounter (Signed)
 Please review, patient is schedule with you this afternoon in office.

## 2023-07-24 NOTE — Telephone Encounter (Signed)
 Copied from CRM [id number]. Topic: Clinical - Red Word Triage >> Jul 24, 2023  8:24 AM Rosamond Comes wrote: Red Word that prompted transfer to Nurse Triage: patient calling possible UTI, burning when urinating, pressure to "go" all the time, hard to sit due to pressure, did a telehealth visit prescribed cephALEXin  (KEFLEX ) 500 MG capsule patient had bad reaction to medication   Chief Complaint: UTI on Antibiotic Follow Up Symptoms: Frequency, Urgency, Dysuria  Frequency: Since Last Tuesday  Pertinent Negatives: Patient denies fever, back pain, chest pain, swelling, itching  Disposition: [] ED /[] Urgent Care (no appt availability in office) / [x] Appointment(In office/virtual)/ []  [facility]/ [] Home Care/ [] Refused Recommended Disposition /[] [facility]/ []  Follow-up with PCP Additional Notes: Patient is currently undergoing treatment with antibiotics for Urinary tract infection (UTI). Reports Experiencing side effects (specify). Reports a cessation of treatment due to feeling really sick shortly after starting the antibiotic course. No new or worsening symptoms reported.    Reason for Disposition  [1] Taking antibiotic > 72 hours (3 days) for UTI AND [2] painful urination or frequency is SAME (unchanged, not better)  Answer Assessment - Initial Assessment Questions 1. MAIN SYMPTOM: "What is the main symptom you are concerned about?" (e.g., painful urination, urine frequency)     Frequency, Urgency 2. BETTER-SAME-WORSE: "Are you getting better, staying the same, or getting worse compared to how you felt at your last visit to the doctor (most recent medical visit)?"     Same  3. PAIN: "How bad is the pain?"  (e.g., Scale 1-10; mild, moderate, or severe)   - MILD (1-3): complains slightly about urination hurting   - MODERATE (4-7): interferes with normal activities     - SEVERE (8-10): excruciating, unwilling or unable to urinate because of the pain      Mild to Moderate  4.  FEVER: "Do you have a fever?" If Yes, ask: "What is it, how was it measured, and when did it start?"     No  5. OTHER SYMPTOMS: "Do you have any other symptoms?" (e.g., blood in the urine, flank pain, vaginal discharge)     No  6. DIAGNOSIS: "When was the UTI diagnosed?" "By whom?" "Was it a kidney infection, bladder infection or both?"     Last Tuesday  7. ANTIBIOTIC: "What antibiotic(s) are you taking?" "How many times per day?"     Cephalexin  500 MG BID  8. ANTIBIOTIC - START DATE: "When did you start taking the antibiotic?"     Last Tuesday  Protocols used: Urinary Tract Infection on Antibiotic Follow-up Call - [facility]

## 2023-07-24 NOTE — Assessment & Plan Note (Addendum)
 Small amount of blood clots on urine sample in office today. History of microscopic hematuria suspected to be due to nephrolithiasis. No having urinary symptoms. Tried keflex  for 4 days last week but had rash and swelling in lips that has since resolved. Keflex  added to allergy list. Urine dipstick positive for blood, leukocytes, and protein. Non toxic appearing on exam. Possible nephrolithiasis vs UTI.  -UA w/r reflex micropsic, urine culture -CT renal stone study  -NSAIDs for pain -Encourage PO hydration -ED precautions for uncontrollable pain or fever

## 2023-07-25 ENCOUNTER — Ambulatory Visit
Admission: RE | Admit: 2023-07-25 | Discharge: 2023-07-25 | Disposition: A | Discharge status: Home / Self Care | Source: Ambulatory Visit | Attending: Student | Admitting: Student

## 2023-07-25 ENCOUNTER — Ambulatory Visit: Payer: Self-pay | Admitting: Student

## 2023-07-25 DIAGNOSIS — R319 Hematuria, unspecified: Secondary | ICD-10-CM | POA: Diagnosis not present

## 2023-07-25 DIAGNOSIS — R109 Unspecified abdominal pain: Secondary | ICD-10-CM | POA: Diagnosis not present

## 2023-07-25 DIAGNOSIS — N200 Calculus of kidney: Secondary | ICD-10-CM | POA: Diagnosis not present

## 2023-07-25 LAB — BASIC METABOLIC PANEL WITH GFR
BUN/Creatinine Ratio: 21 (ref 12–28)
BUN: 21 mg/dL (ref 8–27)
CO2: 24 mmol/L (ref 20–29)
Calcium: 8.6 mg/dL — ABNORMAL LOW (ref 8.7–10.3)
Chloride: 106 mmol/L (ref 96–106)
Creatinine, Ser: 1.02 mg/dL — ABNORMAL HIGH (ref 0.57–1.00)
Glucose: 88 mg/dL (ref 70–99)
Potassium: 4.6 mmol/L (ref 3.5–5.2)
Sodium: 143 mmol/L (ref 134–144)
eGFR: 62 mL/min/{1.73_m2} (ref >59)

## 2023-07-25 LAB — URINALYSIS, ROUTINE W REFLEX MICROSCOPIC
Bilirubin, UA: NEGATIVE
Glucose, UA: NEGATIVE
Ketones, UA: NEGATIVE
Nitrite, UA: NEGATIVE
Specific Gravity, UA: 1.027 (ref 1.005–1.030)
Urobilinogen, Ur: 1 mg/dL (ref 0.2–1.0)
pH, UA: 7.5 (ref 5.0–7.5)

## 2023-07-25 LAB — MICROSCOPIC EXAMINATION
Bacteria, UA: NONE SEEN
Casts: NONE SEEN /LPF
Epithelial Cells (non renal): NONE SEEN /HPF (ref 0–10)
RBC, Urine: >30 /HPF — AB (ref 0–2)
WBC, UA: >30 /HPF — AB (ref 0–5)

## 2023-07-26 ENCOUNTER — Encounter: Payer: Self-pay | Admitting: Student

## 2023-07-27 ENCOUNTER — Ambulatory Visit: Payer: Self-pay | Admitting: Student

## 2023-07-27 LAB — URINE CULTURE

## 2023-07-27 MED ORDER — SULFAMETHOXAZOLE-TRIMETHOPRIM 800-160 MG PO TABS: 1.0000 | ORAL_TABLET | Freq: Two times a day (BID) | ORAL | 0 refills | Status: AC

## 2024-01-04 ENCOUNTER — Other Ambulatory Visit: Payer: Self-pay | Admitting: Internal Medicine

## 2024-01-04 DIAGNOSIS — G43009 Migraine without aura, not intractable, without status migrainosus: Secondary | ICD-10-CM

## 2024-01-07 NOTE — Telephone Encounter (Signed)
 Requested Prescriptions  Pending Prescriptions Disp Refills   rizatriptan  (MAXALT -MLT) 10 MG disintegrating tablet 10 tablet 1    Sig: Dissolve 1 tablet by mouth as needed for migraine. May repeat in 2 hours if needed. Do not exceed more than 3 tablets in a 24 hour period.     Neurology:  Migraine Therapy - Triptan Passed - 01/07/2024  3:32 PM      Passed - Last BP in normal range    BP Readings from Last 1 Encounters:  07/24/23 118/82         Passed - Valid encounter within last 12 months    Recent Outpatient Visits           5 months ago Hematuria, unspecified type   [facility], [place]   7 months ago Annual physical exam   [facility] at [facility], Leita DEL, MD       Future Appointments             In 4 months Lemon Raisin, MD [facility] at [facility], [phone] [place]

## 2024-06-02 ENCOUNTER — Encounter: Admitting: Student
